# Patient Record
Sex: Female | Born: 1975 | Race: White | Hispanic: No | Marital: Married | Smoking: Never smoker
Health system: Southern US, Community
[De-identification: ages and names within clinical notes are randomized; demographics above are authoritative.]

## PROBLEM LIST (undated history)

## (undated) ENCOUNTER — Inpatient Hospital Stay (HOSPITAL_COMMUNITY): Payer: Self-pay

## (undated) DIAGNOSIS — F32A Depression, unspecified: Secondary | ICD-10-CM

## (undated) DIAGNOSIS — I8390 Asymptomatic varicose veins of unspecified lower extremity: Secondary | ICD-10-CM

## (undated) DIAGNOSIS — R519 Headache, unspecified: Secondary | ICD-10-CM

## (undated) DIAGNOSIS — C801 Malignant (primary) neoplasm, unspecified: Secondary | ICD-10-CM

## (undated) DIAGNOSIS — F329 Major depressive disorder, single episode, unspecified: Secondary | ICD-10-CM

## (undated) DIAGNOSIS — F419 Anxiety disorder, unspecified: Secondary | ICD-10-CM

## (undated) DIAGNOSIS — K219 Gastro-esophageal reflux disease without esophagitis: Secondary | ICD-10-CM

## (undated) DIAGNOSIS — R51 Headache: Secondary | ICD-10-CM

## (undated) DIAGNOSIS — Z98891 History of uterine scar from previous surgery: Secondary | ICD-10-CM

## (undated) HISTORY — PX: COLONOSCOPY: SHX174

## (undated) HISTORY — PX: SIGMOIDECTOMY: SHX176

## (undated) HISTORY — PX: COLON SURGERY: SHX602

## (undated) HISTORY — PX: TONSILLECTOMY: SUR1361

## (undated) HISTORY — DX: Asymptomatic varicose veins of unspecified lower extremity: I83.90

---

## 2006-12-10 ENCOUNTER — Inpatient Hospital Stay (HOSPITAL_COMMUNITY): Admission: RE | Admit: 2006-12-10 | Discharge: 2006-12-13 | Payer: Self-pay | Admitting: Obstetrics and Gynecology

## 2008-04-22 ENCOUNTER — Ambulatory Visit: Payer: Self-pay | Admitting: Internal Medicine

## 2008-04-22 ENCOUNTER — Inpatient Hospital Stay (HOSPITAL_COMMUNITY): Admission: EM | Admit: 2008-04-22 | Discharge: 2008-04-23 | Payer: Self-pay | Admitting: Emergency Medicine

## 2008-04-22 ENCOUNTER — Emergency Department (HOSPITAL_COMMUNITY): Admission: EM | Admit: 2008-04-22 | Discharge: 2008-04-22 | Payer: Self-pay | Admitting: Emergency Medicine

## 2008-04-23 ENCOUNTER — Encounter (INDEPENDENT_AMBULATORY_CARE_PROVIDER_SITE_OTHER): Payer: Self-pay | Admitting: Gastroenterology

## 2011-04-21 NOTE — Op Note (Signed)
NAMESOLASH, TULLO               ACCOUNT NO.:  [id number]   MEDICAL RECORD NO.:  [id number]          PATIENT TYPE:  EMS   LOCATION:  MAJO                         FACILITY:  [facility]   PHYSICIAN:  Shirley Friar, MDDATE OF BIRTH:  1976-03-27   DATE OF PROCEDURE:  DATE OF DISCHARGE:  04/22/2008                               OPERATIVE REPORT   INDICATION:  Hematemesis.   MEDICATIONS:  Fentanyl 75 mcg IV, Versed 8 mg IV, and Phenergan 12.5 mg  IV.   FINDINGS:  Endoscope was inserted through oropharynx, esophagus was  intubated, and advanced down to the GE junction.  No active bleeding was  seen in the esophagus.  The endoscope was advanced into the stomach and  no blood products were noted.  Retroflexion was done, which revealed a  small healing Mallory-Weiss tear at the gastroesophageal junction.  There was some surrounding erythema surrounding that area as well.  Endoscope was straightened and advanced down to the distal part of the  stomach, where a benign-appearing, 4-mm, raised nodule was noted.  Endoscope was advanced into the duodenal bulb and second portion of  duodenum, which were both normal.  Endoscope was withdrawn back into the  stomach and again retroflexion was done, which again revealed this  Mallory-Weiss tear that was healing.  No active bleeding was seen.  Endoscope was straightened and advanced back down to the distal gastric  nodule and one biopsy of that was taken.  Endoscope was then withdrawn  to confirm the above findings.   ASSESSMENT:  1. 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  2. Benign-appearing stomach nodule, status post biopsy.  3. No active bleeding.   PLAN:  1. Follow up with pathology as an outpatient.  2. Liquid diet and advance.  3. Protonix p.o. daily x 4 weeks.  4. Avoid NSAIDs.      Shirley Friar, MD  Electronically Signed     VCS/MEDQ  D:  04/23/2008  T:  04/24/2008  Job:  [id number]   cc:   Lenoard Aden, M.D.

## 2011-04-21 NOTE — Consult Note (Signed)
NAMESAHARA, Sydney Goodwin NO.:  [id number]   MEDICAL RECORD NO.:  [id number]           PATIENT TYPE:   LOCATION:                                 FACILITY:   PHYSICIAN:  Shirley Friar, MD     DATE OF BIRTH:   DATE OF CONSULTATION:  DATE OF DISCHARGE:                                 CONSULTATION   REQUESTING PHYSICIAN:  Angeles Secord.   REASON:  Hematemesis.   HISTORY OF PRESENT ILLNESS:  Ms. Sydney Goodwin is a pleasant 35 year old white  female who was admitted secondary to episodes of coffee-ground emesis  over the weekend.  She had some associated nausea, but denied any severe  abdominal pain.  Has some discomfort after the vomiting that she had.  She does acknowledge taking Advil and Excedrin off and on over the last  few months due to pain while playing tennis.  She denies any previous  episodes of hematemesis.   PAST MEDICAL HISTORY:  1. Anxiety.  2. History of headaches.   MEDICATIONS:  Effexor and NSAIDs (Advil and Excedrin).   SOCIAL HISTORY:  Rare alcohol, denies tobacco.   FAMILY HISTORY:  Noncontributory.   REVIEW OF SYSTEMS:  Negative except as stated above.   PHYSICAL EXAM:  VITAL SIGNS:  Temperature 98.6, pulse 70, blood pressure  115/45, and O2 sat is 98% on room air.  GENERAL:  Alert in no acute distress.  ABDOMEN:  Soft, nontender, nondistended, and active bowel sounds.   LABS:  Hemoglobin 9 down from 12.6 on the Apr 22, 2008, BUN 9 down from  21 on admission, platelet count 158,000, and white blood count 6.7.   IMPRESSION:  A 35 year old white female with 2 episodes of coffee-ground  emesis in the setting of NSAID use.  I suspect upper GI bleed with  differential being peptic ulcer disease versus Mallory-Weiss tear versus  hemorrhagic gastritis.  She could also have erosive esophagitis, but she  probably has a small ulcer that caused the bleeding versus  a Mallory-Weiss tear.  We will plan to do upper endoscopy this morning.  I  discussed the risks and benefits of procedure, and she agrees to  proceed.  Agree with IV Protonix and we will decide on outpatient use of  that pain depending on the upper endoscopy findings.      Shirley Friar, MD  Electronically Signed     VCS/MEDQ  D:  04/23/2008  T:  04/23/2008  Job:  [id number]

## 2011-04-24 NOTE — Discharge Summary (Signed)
Sydney Goodwin, Sydney Goodwin               ACCOUNT NO.:  [id number]   MEDICAL RECORD NO.:  [id number]          PATIENT TYPE:  INP   LOCATION:  9115                          FACILITY:  [facility]   PHYSICIAN:  Lenoard Aden, M.D.DATE OF BIRTH:  August 31, 1976   DATE OF ADMISSION:  12/10/2006  DATE OF DISCHARGE:  12/13/2006                               DISCHARGE SUMMARY   Patient underwent an uncomplicated repeat C-section; postoperative  course uncomplicated.  Discharged to home on day three.  Discharge  teaching done.  Discharge medications to include Tylox, prenatal  vitamins, and iron.  Follow up in the office in four to six weeks.      Lenoard Aden, M.D.  Electronically Signed     RJT/MEDQ  D:  01/25/2007  T:  01/25/2007  Job:  [id number]

## 2011-04-24 NOTE — Discharge Summary (Signed)
Sydney Goodwin, Sydney Goodwin               ACCOUNT NO.:  [id number]   MEDICAL RECORD NO.:  [id number]          PATIENT TYPE:  INP   LOCATION:  2630                         FACILITY:  [facility]   PHYSICIAN:  Madaline Guthrie, M.D.    DATE OF BIRTH:  [birth year]   DATE OF ADMISSION:  04/22/2008  DATE OF DISCHARGE:  04/23/2008                               DISCHARGE SUMMARY   PRIMARY CARE PHYSICIAN:  [facility].   DISCHARGE DIAGNOSES:  1. Upper gastrointestinal bleeding secondary to Mallory-Weiss tear at      gastroesophageal junction.  2. Benign-appearing stomach nodule, awaiting biopsy results.  3. Anxiety.  4. Chronic headache related to stress.  5. Hypokalemia related to vomiting.   DISCHARGE MEDICATIONS:  1. Protonix 40 mg by mouth once daily.  2. Ultram 60 mg by mouth once every 8 hours as needed for pain.  3. Effexor XR 37.5 mg by mouth once daily.   DISPOSITION AND FOLLOWUP:  The patient will follow with her primary care  physician at [facility]  She is got a followup appointment with  Dr. Valentina Lucks on May 01, 2008 at 11 a.m.  At the followup visit, the  patient will be reviewed for resolution of her upper GI bleeding,  nausea, and vomiting.  Also, the primary care physician is requested to  follow the results of the stomach nodule biopsy.  A repeat CBC and BMET  examination is recommended at the followup visit to check the patient's  hemoglobin and hematocrit as well as serum potassium.   STUDIES AND PROCEDURES:  Upper GI endoscopy on Apr 23, 2008.  Findings,  healing Mallory-Weiss tear at gastroesophageal junction.  Benign-  appearing stomach nodule status post biopsy.  No active bleeding.   CONSULTANT:  Shirley Friar, MD, Gastroenterology.   ADMISSION HISTORY:  The patient is a 35 year old with no significant  past medical history other than occipital headaches related to stress.  She presented to ED on Apr 22, 2008 with complaints of vomiting of  blood.  This  happened after she took 4 Advils around 4 p.m. one day  before the presentation.  She also took two Excedrin tablets a few hours  later.  The patient woke up early in the morning on the day of admission  feeling nauseous and vomited dark-colored material.  It was blackish,  but she did not notice any frank blood.  This was followed by blackish  diarrhea.  She denied any pain of abdomen, fever, chest pain, or  shortness of breath.  She came to the ED after this and was sent home  after evaluation.  She had another episode of hematemesis at home, which  she described as being blueberry likely in color.  Hence, she came back  to the ED.   ADMISSION PHYSICAL:  VITAL SIGNS:  Temperature 98.4, blood pressure  140/84, pulse 125, respiratory rate 18, and oxygen saturation 97% on  room air. ORTHOSTATIC VITALS:  Lying 124/74 with pulse rate of 110,  sitting 119/77 with pulse rate of 101, and standing 70/81 with pulse  rate of 118.  GENERAL:  NAD, but was feeling nauseated.  EYES:  PERRLA.  ENT:  Clear oropharynx with moist mucous membranes.  CHEST:  Clear to auscultation bilaterally with good air movement.  CARDIOVASCULAR:  Tachycardiac, but regular rhythm.  No murmur, rubs, or  gallops.  Good pedal pulses.  ABDOMEN:  Soft, tender to percussion in the epigastric area.  No  guarding or rigidity.  No organomegaly could be appreciated.  Positive  bowel sounds.  EXTREMITIES:  No edema.  SKIN:  Moist.  NEURO:  Alert and oriented x3 and nonfocal.  PSYCHE:  Anxious appearing, otherwise appropriate.   ADMISSION LABS:  Sodium 142, potassium 4.2, chloride 107, bicarbonate  22, BUN 20, creatinine 0.7, and blood glucose 112.  Hemoglobin 12.1  followed by 11.1 followed by 9.7 followed by 9.  MCV 91, WBC 10.1, ANC  8.3, and platelets 234.  Bilirubin 0.7, alkaline phosphate 46, AST 17,  ALT 17, protein 6.8, albumin 3.8, and calcium 8.5.  PT 12.5, INR of 0.9,  and PTT 28.  FOBT positive.  Urine pregnancy  test negative.  Urinalysis  negative.   HOSPITAL COURSE BY PROBLEMS:  1. Upper gastrointestinal bleeding.  This was most likely precipitated      by NSAID use.  The patient was admitted to the Step-Down Unit for      close monitoring and workup on her problem.  She was placed n.p.o.,      given IV fluids and antiemetics.  Her CBC was closely monitored as      noted above.  She did not have any postural drop and was having      sinus tachycardia.  She responded well to intravenous fluids, and      although her hemoglobin did drop down to 9, it remained stable      thereafter.  She did not have any further episodes of nausea,      vomiting, or hematemesis in the hospital, although she continued to      have melena.  No frank blood was noted.  A gastroenterological      consultation was subsequently obtained, and she underwent an EGD      examination, results of which are mentioned as above.  The patient      was advised to avoid NSAID and was placed on proton pump      inhibitors.   1. Hypokalemia.  This was noted on the repeat BMET examination and was      repleted with potassium supplement.   1. Anxiety disorder.  Her regular medication Effexor was continued.      The patient also required p.r.n. Ativan for her anxiety.   1. Headache.  She did not have any active pain.  However, the patient      was advised to avoid NSAID in the future.   DISCHARGE DAY LABS:  Hemoglobin 9, hematocrit 26.5, WBC 6.7, MCV 91.6,  and platelets 158.  Sodium 140, potassium 3.4, chloride 113, bicarbonate  22, glucose 100, BUN 9, creatinine 0.59, and calcium 7.9.   DISCHARGE DAY VITALS:  Temperature maximum 98.5, blood pressure systole  95-115, diastolic 50-45, pulse 70, respiratory rate 20, and oxygen  saturation 99% on room air.   On the day of discharge, the patient's condition was stable.  She was no  longer complaining of any nausea, vomiting, or abdominal pain.      [name], MD   Electronically Signed      Madaline Guthrie, M.D.  Electronically Signed  AS/MEDQ  D:  04/25/2008  T:  04/26/2008  Job:  [id number]   cc:   Gretta Arab. Valentina Lucks, M.D.  Shirley Friar, MD

## 2011-04-24 NOTE — Op Note (Signed)
NAMEMASSIE, COGLIANO               ACCOUNT NO.:  [id number]   MEDICAL RECORD NO.:  [id number]          PATIENT TYPE:  INP   LOCATION:  9115                          FACILITY:  [facility]   PHYSICIAN:  Lenoard Aden, M.D.DATE OF BIRTH:  May 08, 1976   DATE OF PROCEDURE:  12/10/2006  DATE OF DISCHARGE:                               OPERATIVE REPORT   PREOPERATIVE DIAGNOSIS:  A 39-week intrauterine pregnancy with previous  cesarean section x2; for repeat.   POSTOPERATIVE DIAGNOSIS:  A 39-week intrauterine pregnancy with previous  cesarean section x2; for repeat.   PROCEDURE:  Repeat low segment transverse cesarean section.   SURGEON:  Lenoard Aden, M.D.   ASSISTANT:  Marlinda Mike, C.N.M.   ESTIMATED BLOOD LOSS:  1000 cc.   COMPLICATIONS:  None.   DRAINS:  Foley.   COUNTS:  Correct.   CONDITION:  The patient to recovery room in good condition.   FINDINGS:  A full-term living female, Agpars 4 and 57.  Occiput anterior  position.  Normal tubes, normal ovaries.  A 2-layer closure of the  uterus.   BRIEF OPERATIVE NOTE:  Being apprised of the risks of anesthesia,  infection, bleeding, injury to abdominal organs and need for repair,  delayed risk and any complications to include bowel and bladder injury.  The patient was brought to the operating room where she was administered  spinal anesthetic without complications.  Prepped and draped in the  usual sterile fashion.  A Foley catheter was placed.  After achieving  adequate anesthesia during spinal anesthetic, the patient was prepped  and draped in the usual sterile fashion.  A Pfannenstiel skin incision  was made with a scalpel and carried down to the fascia, where she was  nicked in the midline and __________ with Mayo scissors; rectus muscles  were dissected sharply in the midline.  Peritoneum entered and  intraperitoneal space entered without difficulty.  Visceroperitoneum  scored sharply __________  segment, Sharl Ma hysterotomy  incision made.  Atraumatic delivery.  Amniotomy with clear fluid.  Used with vacuum  assistance x1 pull.  A full-term living female, as noted.  Cord blood  collected.  Placenta delivered from the posterior position manually  intact.  Vessel cord noted.  Uterus was closed in a running imbricating  2-layer closure of 0 Monocryl suture.  Bladder flap inspected and found  to be hemostatic.  Irrigation accomplished.  Fascia closed using the 0  Monocryl in running fashion.  Skin closed using staples.   The patient tolerated the procedure well.  She was transferred to  recovery room in good condition.      Lenoard Aden, M.D.  Electronically Signed     RJT/MEDQ  D:  12/10/2006  T:  12/10/2006  Job:  [id number]

## 2011-09-02 LAB — COMPREHENSIVE METABOLIC PANEL
ALT: 17
Albumin: 3.8
Calcium: 8.5
GFR calc Af Amer: >60
Glucose, Bld: 114 — ABNORMAL HIGH
Sodium: 139
Total Protein: 6.8

## 2011-09-02 LAB — URINALYSIS, ROUTINE W REFLEX MICROSCOPIC
Glucose, UA: NEGATIVE
Ketones, ur: 15 — AB
Protein, ur: 30 — AB
Urobilinogen, UA: 0.2

## 2011-09-02 LAB — CBC
HCT: 26.5 — ABNORMAL LOW
HCT: 28.3 — ABNORMAL LOW
HCT: 36
MCHC: 34.3
MCV: 90.9
MCV: 91
MCV: 91.6
MCV: 91.8
Platelets: 158
Platelets: 194
Platelets: 220
Platelets: 234
RBC: 3.09 — ABNORMAL LOW
RBC: 3.59 — ABNORMAL LOW
RBC: 3.86 — ABNORMAL LOW
RDW: 13.1
RDW: 13.7
WBC: 10
WBC: 10.1
WBC: 8.9

## 2011-09-02 LAB — POCT I-STAT, CHEM 8
BUN: 21
Creatinine, Ser: 0.7
HCT: 37
Hemoglobin: 12.6
Hemoglobin: 12.6
Potassium: 3.8
Sodium: 139
Sodium: 142
TCO2: 22
TCO2: 23

## 2011-09-02 LAB — CROSSMATCH
ABO/RH(D): A POS
Antibody Screen: NEGATIVE

## 2011-09-02 LAB — BASIC METABOLIC PANEL
BUN: 9
Chloride: 113 — ABNORMAL HIGH
Glucose, Bld: 100 — ABNORMAL HIGH
Potassium: 3.4 — ABNORMAL LOW

## 2011-09-02 LAB — DIFFERENTIAL
Basophils Relative: 0
Eosinophils Absolute: 0.1
Eosinophils Relative: 1
Lymphocytes Relative: 13
Lymphs Abs: 1.3
Lymphs Abs: 1.5
Monocytes Relative: 4
Neutrophils Relative %: 77
Neutrophils Relative %: 83 — ABNORMAL HIGH

## 2011-09-02 LAB — PROTIME-INR: Prothrombin Time: 12.5

## 2011-09-02 LAB — URINE MICROSCOPIC-ADD ON

## 2011-09-02 LAB — OCCULT BLOOD X 1 CARD TO LAB, STOOL: Fecal Occult Bld: POSITIVE

## 2011-09-02 LAB — POCT PREGNANCY, URINE: Operator id: 192351

## 2011-09-02 LAB — ABO/RH: ABO/RH(D): A POS

## 2011-12-03 ENCOUNTER — Ambulatory Visit (INDEPENDENT_AMBULATORY_CARE_PROVIDER_SITE_OTHER): Payer: BC Managed Care – PPO

## 2011-12-03 DIAGNOSIS — F411 Generalized anxiety disorder: Secondary | ICD-10-CM

## 2011-12-03 DIAGNOSIS — F339 Major depressive disorder, recurrent, unspecified: Secondary | ICD-10-CM

## 2012-02-07 ENCOUNTER — Other Ambulatory Visit: Payer: Self-pay | Admitting: Family Medicine

## 2012-02-07 MED ORDER — VENLAFAXINE HCL ER 75 MG PO CP24: 75.0000 mg | ORAL_CAPSULE | Freq: Every day | ORAL | Status: DC

## 2013-01-21 ENCOUNTER — Other Ambulatory Visit: Payer: Self-pay | Admitting: Family Medicine

## 2013-01-21 DIAGNOSIS — R899 Unspecified abnormal finding in specimens from other organs, systems and tissues: Secondary | ICD-10-CM

## 2013-01-23 ENCOUNTER — Other Ambulatory Visit (INDEPENDENT_AMBULATORY_CARE_PROVIDER_SITE_OTHER): Payer: BC Managed Care – PPO | Admitting: Family Medicine

## 2013-01-23 ENCOUNTER — Telehealth: Payer: Self-pay | Admitting: Radiology

## 2013-01-23 ENCOUNTER — Other Ambulatory Visit: Payer: Self-pay | Admitting: Family Medicine

## 2013-01-23 ENCOUNTER — Other Ambulatory Visit: Payer: Self-pay | Admitting: Radiology

## 2013-01-23 VITALS — BP 124/81 | HR 116 | Temp 98.3°F | Resp 16 | Ht 64.75 in | Wt 177.8 lb

## 2013-01-23 DIAGNOSIS — R6889 Other general symptoms and signs: Secondary | ICD-10-CM

## 2013-01-23 DIAGNOSIS — R899 Unspecified abnormal finding in specimens from other organs, systems and tissues: Secondary | ICD-10-CM

## 2013-01-23 LAB — POCT URINE PREGNANCY: Preg Test, Ur: POSITIVE

## 2013-01-23 LAB — POCT URINALYSIS DIPSTICK
Blood, UA: NEGATIVE
Glucose, UA: NEGATIVE
Nitrite, UA: NEGATIVE
Urobilinogen, UA: 0.2

## 2013-01-23 LAB — POCT UA - MICROSCOPIC ONLY
Crystals, Ur, HPF, POC: NEGATIVE
RBC, urine, microscopic: NEGATIVE

## 2013-01-23 LAB — HEPATIC FUNCTION PANEL
Albumin: 4.5 g/dL (ref 3.5–5.2)
Total Protein: 6.8 g/dL (ref 6.0–8.3)

## 2013-01-23 NOTE — Progress Notes (Signed)
Here to have follow- up labs from her OBG.  She was seen at her OBGs office (Dr. Jose Persia NP) about 10 days ago.  She saw them due to night sweats which she had noted for one week- this has since resolved.  She had noted them along with a missed period.  Her hormone levels were all ok- labs ok except her ALT was around 100.  She is here today to recheck her LFTs, I also decided to recheck her urine as she had microhematuria with a UTI in late 2012  She would like to have an ultrasound to evaluate her gallbladder   LMP was 12/03/12.  She has been taking OCP for a while after having her mirena removed in September- took OCP for 6- 8 weeks but then stopped due to SE.  Her husband then had a vasectomy.  They think that pregnancy is unlikely but would like to have a test today.    Results for orders placed in visit on 01/23/13  POCT URINALYSIS DIPSTICK      Result Value Range   Color, UA yellow     Clarity, UA clear     Glucose, UA neg     Bilirubin, UA neg     Ketones, UA neg     Spec Grav, UA <=1.005     Blood, UA neg     pH, UA 6.0     Protein, UA neg     Urobilinogen, UA 0.2     Nitrite, UA neg     Leukocytes, UA Trace    POCT UA - MICROSCOPIC ONLY      Result Value Range   WBC, Ur, HPF, POC 0-2     RBC, urine, microscopic neg     Bacteria, U Microscopic neg     Mucus, UA neg     Epithelial cells, urine per micros neg     Crystals, Ur, HPF, POC neg     Casts, Ur, LPF, POC neg     Yeast, UA neg    POCT URINE PREGNANCY      Result Value Range   Preg Test, Ur Positive     Sydney Goodwin and her husband were quite surprised at her positive HCG today, but they are positive about another child.  She will call her OBG today and arrange an appt.  I will go ahead and order an abdominal ultrasound to evaluate her gallbladder/ liver and recheck LFTs today. Follow- up as soon as results are in.  Ultrasound scheduled for this Thursday 2/19 at 8:15 am, GI on [address].   Noted tachycardia today- Sydney Goodwin  tends to have some anxiety especially around her health, and admits to feeling tense.  She then received some very surprising news.  Her BP is acceptable, no proteinuria

## 2013-01-23 NOTE — Telephone Encounter (Signed)
[facility] they can do this on Thursday 8:30, [address] nothing to eat or drink after midnight. Patient advised

## 2013-01-23 NOTE — Patient Instructions (Addendum)
Please call your OB right away and schedule your first appt.  I will go ahead and set up an ultrasound to check your liver and gallbladder hopefully on Thursday- we will give you a call in  alittle bit.    Assuming your true last period was 12/03/12 you will be due 09/09/2013 and you are 7+2 weeks today Please start taking a pre- natal vitamin and stop taking your effexor- you could also discuss taking the effexor with Dr. Billy Coast to see if you might be able to continue using it.

## 2013-01-24 ENCOUNTER — Telehealth: Payer: Self-pay

## 2013-01-24 ENCOUNTER — Telehealth: Payer: Self-pay | Admitting: Family Medicine

## 2013-01-24 LAB — IRON AND TIBC: %SAT: 23 % (ref 20–55)

## 2013-01-24 NOTE — Telephone Encounter (Signed)
Message copied by Johnnette Litter on Tue Jan 24, 2013  4:43 PM ------      Message from: Abbe Amsterdam C      Created: Tue Jan 24, 2013  2:36 PM       Hi! Can we add on some labs (if possible)- PT/ INR, iron, ferritin and TIBC, and chronic hep panel.        Dx: elevated liver enzymes            Thanks so much!        JC ------

## 2013-01-24 NOTE — Telephone Encounter (Signed)
Left message on machine- I called [facility] and set up an appt for Thursday 2/20 at 10:45 am with Dr. Dulce Sellar.  Please let me know if that will not work, and I am going to have the lab add on some additional tests that will be needed as part of elevated LFT work- up.    Hep B and Hep C panel Iron and TIBC Ferritin PT/INR

## 2013-01-24 NOTE — Telephone Encounter (Signed)
Spoke with Selena Batten at [facility]. I added on the Fe/TIBC and the Ferritin. Unable to add on PT/INR because it is done on a blue top. Before adding on the Chronic Hep, I wanted to double check. Ins won't pay for a Chronic panel and it's expensive. Is an acute panel ok?

## 2013-01-24 NOTE — Telephone Encounter (Signed)
Yes. Acute panel is ok- thanks!

## 2013-01-25 LAB — HEPATITIS PANEL, ACUTE
Hep A IgM: NEGATIVE
Hep B C IgM: NEGATIVE

## 2013-01-25 NOTE — Telephone Encounter (Signed)
Have notified lab. Luster Landsberg will take care of add on labs.

## 2013-01-26 ENCOUNTER — Ambulatory Visit
Admission: RE | Admit: 2013-01-26 | Discharge: 2013-01-26 | Disposition: A | Discharge status: Home / Self Care | Payer: BC Managed Care – PPO | Source: Ambulatory Visit | Attending: Family Medicine | Admitting: Family Medicine

## 2013-02-15 LAB — OB RESULTS CONSOLE ABO/RH: RH Type: POSITIVE

## 2013-02-15 LAB — OB RESULTS CONSOLE HIV ANTIBODY (ROUTINE TESTING): HIV: NONREACTIVE

## 2013-02-15 LAB — OB RESULTS CONSOLE RPR: RPR: NONREACTIVE

## 2013-04-03 ENCOUNTER — Telehealth: Payer: Self-pay | Admitting: Family Medicine

## 2013-04-03 DIAGNOSIS — R3129 Other microscopic hematuria: Secondary | ICD-10-CM

## 2013-04-03 NOTE — Telephone Encounter (Signed)
Called to discuss with her today.  She is doing well, pregnancy is going well and her LFTs have come back to near normal.  Shared that I would like to have her seen by urology, to be sure there is nothing else of concern.  She is willing to proceed. Will do referral for her

## 2013-06-04 ENCOUNTER — Inpatient Hospital Stay (HOSPITAL_COMMUNITY)
Admission: AD | Admit: 2013-06-04 | Discharge: 2013-06-04 | Disposition: A | Discharge status: Home / Self Care | Payer: BC Managed Care – PPO | Source: Ambulatory Visit | Attending: Obstetrics & Gynecology | Admitting: Obstetrics & Gynecology

## 2013-06-04 ENCOUNTER — Encounter (HOSPITAL_COMMUNITY): Payer: Self-pay

## 2013-06-04 DIAGNOSIS — O99891 Other specified diseases and conditions complicating pregnancy: Secondary | ICD-10-CM | POA: Insufficient documentation

## 2013-06-04 DIAGNOSIS — W010XXA Fall on same level from slipping, tripping and stumbling without subsequent striking against object, initial encounter: Secondary | ICD-10-CM | POA: Insufficient documentation

## 2013-06-04 DIAGNOSIS — S90129A Contusion of unspecified lesser toe(s) without damage to nail, initial encounter: Secondary | ICD-10-CM | POA: Insufficient documentation

## 2013-06-04 DIAGNOSIS — IMO0002 Reserved for concepts with insufficient information to code with codable children: Secondary | ICD-10-CM | POA: Insufficient documentation

## 2013-06-04 DIAGNOSIS — Y929 Unspecified place or not applicable: Secondary | ICD-10-CM | POA: Insufficient documentation

## 2013-06-04 HISTORY — DX: Gastro-esophageal reflux disease without esophagitis: K21.9

## 2013-06-04 HISTORY — DX: Depression, unspecified: F32.A

## 2013-06-04 HISTORY — DX: Anxiety disorder, unspecified: F41.9

## 2013-06-04 HISTORY — DX: Major depressive disorder, single episode, unspecified: F32.9

## 2013-06-04 NOTE — MAU Provider Note (Signed)
  History   Minor trauma. Tripped while running in a dress. No direct abdominal trauma. Pos FM. No contractions, bleeding or LOF.   CSN: [id number]  Arrival date and time: 06/04/13 1954   None     Chief Complaint  Patient presents with  . Fall   HPI  OB History   Grav Para Term Preterm Abortions TAB SAB Ect Mult Living   4 3 3  0 0 0 0 0 0 3      No past medical history on file.  No past surgical history on file.  No family history on file.  History  Substance Use Topics  . Smoking status: Never Smoker   . Smokeless tobacco: Not on file  . Alcohol Use: Not on file    Allergies:  Allergies  Allergen Reactions  . Penicillins Itching and Rash    Prescriptions prior to admission  Medication Sig Dispense Refill  . ibuprofen (ADVIL,MOTRIN) 200 MG tablet Take 400 mg by mouth every 6 (six) hours as needed for pain.      . Prenatal Vit-Fe Fumarate-FA (PRENATAL MULTIVITAMIN) TABS Take 1 tablet by mouth every morning.      . ranitidine (ZANTAC) 150 MG tablet Take 150-300 mg by mouth at bedtime.        ROS Physical Exam   Last menstrual period 12/03/2012.  Physical Exam WDWN WF NAD NCAT Neck : supple with FROM Lgs: CTA CV: RRR Abd: soft , gravid and nontender Neuro non focal Skin : Ecchymotic great toe Minor abrasions to both knees  NST pending  MAU Course  Procedures  MDM na  Assessment and Plan  Minor OB trauma- no direct abdominal trauma or injury 27 3/7 weeks DC home pending NST x one hr. Local care discussed FAC and Labor precautions. Abruption precautions.  Harlon Kutner J 06/04/2013, 8:23 PM

## 2013-06-04 NOTE — MAU Note (Signed)
Pt fell by catching her toe on her maxi dress.  She says she fell to her knees and caught herself with her hands and she thinks she hit her abdomen.  Her left great toe is bruised and blue and both knees are scraped.  Denies bleeding, leaking or cramps in her abdomen.

## 2013-07-27 ENCOUNTER — Other Ambulatory Visit: Payer: Self-pay | Admitting: Obstetrics and Gynecology

## 2013-07-31 LAB — OB RESULTS CONSOLE GBS: GBS: NEGATIVE

## 2013-08-02 ENCOUNTER — Other Ambulatory Visit: Payer: Self-pay | Admitting: Obstetrics and Gynecology

## 2013-08-08 ENCOUNTER — Encounter (HOSPITAL_COMMUNITY): Payer: Self-pay | Admitting: Pharmacist

## 2013-08-22 ENCOUNTER — Encounter (HOSPITAL_COMMUNITY): Payer: Self-pay

## 2013-08-22 ENCOUNTER — Encounter (HOSPITAL_COMMUNITY)
Admission: RE | Admit: 2013-08-22 | Discharge: 2013-08-22 | Disposition: A | Discharge status: Home / Self Care | Payer: BC Managed Care – PPO | Source: Ambulatory Visit | Attending: Obstetrics and Gynecology | Admitting: Obstetrics and Gynecology

## 2013-08-22 LAB — TYPE AND SCREEN: ABO/RH(D): A POS

## 2013-08-22 LAB — CBC
HCT: 30.6 % — ABNORMAL LOW (ref 36.0–46.0)
MCH: 31.4 pg (ref 26.0–34.0)
MCHC: 34.3 g/dL (ref 30.0–36.0)
RDW: 13 % (ref 11.5–15.5)

## 2013-08-22 LAB — RPR: RPR Ser Ql: NONREACTIVE

## 2013-08-22 NOTE — Patient Instructions (Addendum)
18 [birth year][address]UT[address]  08/22[birth year]2013   Your procedure is scheduled on:  08/24/13  Enter through the Main Entrance of [facility] at 6 AM.  Pick up the phone at the desk and dial [phone].   Call this number if you have problems the morning of surgery: [phone]   Remember:   Do not eat food:After Midnight.  Do not drink clear liquids: After Midnight.  Take these medicines the morning of surgery with A SIP OF WATER: zantac   Do not wear jewelry, make-up or nail polish.  Do not wear lotions, powders, or perfumes. You may wear deodorant.  Do not shave 48 hours prior to surgery.  Do not bring valuables to the hospital.  [facility] is not   responsible for any belongings or valuables brought to the hospital.  Contacts, dentures or bridgework may not be worn into surgery.  Leave suitcase in the car. After surgery it may be brought to your room.  For patients admitted to the hospital, checkout time is 11:00 AM the day of              discharge.   Patients discharged the day of surgery will not be allowed to drive             home.  Name and phone number of your driver: NA  Special Instructions:   Shower using CHG 2 nights before surgery and the night before surgery.  If you shower the day of surgery use CHG.  Use special wash - you have one bottle of CHG for all showers.  You should use approximately 1/3 of the bottle for each shower.   Please read over the following fact sheets that you were given:   Surgical Site Infection Prevention

## 2013-08-23 NOTE — H&P (Signed)
Sydney Goodwin, BOTZ               ACCOUNT NO.:  [id number]  MEDICAL RECORD NO.:  [id number]  LOCATION:  PERIO                         FACILITY:  [facility]  PHYSICIAN:  Lenoard Aden, M.D.DATE OF BIRTH:  06-15-1976  DATE OF ADMISSION:  07/12/2013 DATE OF DISCHARGE:  08/22/2013                             HISTORY & PHYSICAL   CHIEF COMPLAINT:  Previous C-section for repeat.  HISTORY OF PRESENT ILLNESS:  She is a 37 year old white female, G4, P3, previous C-section x3, who presents for repeat cesarean section.  ALLERGIES:  She has allergies to penicillin.  MEDICATIONS:  Include prenatal vitamins, Zantac, and Tums as needed.  SOCIAL HISTORY:  She is a nonsmoker, nondrinker.  She denies domestic or physical violence.  PREVIOUS HISTORY:  Surgical history remarkable for C-section x3.  FAMILY HISTORY:  She has a family history of diabetes, kidney stones, and lung cancer.  Prenatal course uncomplicated.  PHYSICAL EXAMINATION:  GENERAL:  This is a well-developed, well- nourished white female, in no acute distress. HEENT:  Normal. NECK:  Supple.  Full range of motion. LUNGS:  Clear. HEART:  Regular rate and rhythm. ABDOMEN:  Soft, gravid, nontender.  No CVA tenderness. EXTREMITIES:  No cords. NEUROLOGIC:  Nonfocal. SKIN:  Intact. PELVIC:  Reveals cervix to be closed, 60%, vertex, -3.  IMPRESSION:  Previous C-section x3 for repeat.  PLAN:  Proceed with repeat low segment transverse cesarean section. Risks of anesthesia, infection, bleeding, injury to surrounding organs, need for repair was discussed, delayed versus immediate complications to include bowel and bladder injury noted.  The patient acknowledges and wishes to proceed.     Lenoard Aden, M.D.     RJT/MEDQ  D:  08/23/2013  T:  08/23/2013  Job:  [id number]

## 2013-08-24 ENCOUNTER — Encounter (HOSPITAL_COMMUNITY): Payer: Self-pay | Admitting: *Deleted

## 2013-08-24 ENCOUNTER — Encounter (HOSPITAL_COMMUNITY)
Admission: AD | Disposition: A | Discharge status: Home / Self Care | Payer: Self-pay | Source: Ambulatory Visit | Attending: Obstetrics and Gynecology

## 2013-08-24 ENCOUNTER — Inpatient Hospital Stay (HOSPITAL_COMMUNITY)
Admission: AD | Admit: 2013-08-24 | Discharge: 2013-08-26 | DRG: 370 | Disposition: A | Discharge status: Home / Self Care | Payer: BC Managed Care – PPO | Source: Ambulatory Visit | Attending: Obstetrics and Gynecology | Admitting: Obstetrics and Gynecology

## 2013-08-24 ENCOUNTER — Inpatient Hospital Stay (HOSPITAL_COMMUNITY): Payer: BC Managed Care – PPO | Admitting: Anesthesiology

## 2013-08-24 ENCOUNTER — Encounter (HOSPITAL_COMMUNITY): Payer: Self-pay | Admitting: Anesthesiology

## 2013-08-24 DIAGNOSIS — O9903 Anemia complicating the puerperium: Secondary | ICD-10-CM | POA: Diagnosis not present

## 2013-08-24 DIAGNOSIS — Z98891 History of uterine scar from previous surgery: Secondary | ICD-10-CM

## 2013-08-24 DIAGNOSIS — D62 Acute posthemorrhagic anemia: Secondary | ICD-10-CM | POA: Diagnosis not present

## 2013-08-24 DIAGNOSIS — Z302 Encounter for sterilization: Secondary | ICD-10-CM

## 2013-08-24 DIAGNOSIS — O34219 Maternal care for unspecified type scar from previous cesarean delivery: Principal | ICD-10-CM | POA: Diagnosis present

## 2013-08-24 DIAGNOSIS — Z880 Allergy status to penicillin: Secondary | ICD-10-CM

## 2013-08-24 HISTORY — DX: History of uterine scar from previous surgery: Z98.891

## 2013-08-24 SURGERY — Surgical Case
Anesthesia: Spinal | Laterality: Bilateral

## 2013-08-24 MED ORDER — PHENYLEPHRINE HCL 10 MG/ML IJ SOLN
INTRAMUSCULAR | Status: DC | PRN
  Administered 2013-08-24 (×2): 80 ug via INTRAVENOUS
  Administered 2013-08-24: 140 ug via INTRAVENOUS

## 2013-08-24 MED ORDER — FENTANYL CITRATE 0.05 MG/ML IJ SOLN
INTRAMUSCULAR | Status: AC
  Filled 2013-08-24: qty 2

## 2013-08-24 MED ORDER — ONDANSETRON HCL 4 MG/2ML IJ SOLN: 4.0000 mg | INTRAMUSCULAR | Status: DC | PRN

## 2013-08-24 MED ORDER — FENTANYL 10 MCG/ML IV SOLN
INTRAVENOUS | Status: DC
  Administered 2013-08-24: 15 ug via INTRAVENOUS
  Filled 2013-08-24: qty 50

## 2013-08-24 MED ORDER — CEFAZOLIN SODIUM-DEXTROSE 2-3 GM-% IV SOLR
INTRAVENOUS | Status: AC
  Filled 2013-08-24: qty 50

## 2013-08-24 MED ORDER — MORPHINE SULFATE 0.5 MG/ML IJ SOLN
INTRAMUSCULAR | Status: AC
  Filled 2013-08-24: qty 10

## 2013-08-24 MED ORDER — LANOLIN HYDROUS EX OINT: 1.0000 "application " | TOPICAL_OINTMENT | CUTANEOUS | Status: DC | PRN

## 2013-08-24 MED ORDER — LACTATED RINGERS IV SOLN
INTRAVENOUS | Status: DC
  Administered 2013-08-24 (×4): via INTRAVENOUS

## 2013-08-24 MED ORDER — EPHEDRINE 5 MG/ML INJ
INTRAVENOUS | Status: AC
  Filled 2013-08-24: qty 10

## 2013-08-24 MED ORDER — OXYCODONE-ACETAMINOPHEN 5-325 MG PO TABS
1.0000 | ORAL_TABLET | ORAL | Status: DC | PRN
  Administered 2013-08-24: 2 via ORAL
  Administered 2013-08-24: 1 via ORAL
  Administered 2013-08-24 – 2013-08-25 (×2): 2 via ORAL
  Filled 2013-08-24 (×4): qty 2

## 2013-08-24 MED ORDER — ONDANSETRON HCL 4 MG PO TABS: 4.0000 mg | ORAL_TABLET | ORAL | Status: DC | PRN

## 2013-08-24 MED ORDER — ONDANSETRON HCL 4 MG/2ML IJ SOLN
INTRAMUSCULAR | Status: DC | PRN
  Administered 2013-08-24: 4 mg via INTRAVENOUS

## 2013-08-24 MED ORDER — DIPHENHYDRAMINE HCL 50 MG/ML IJ SOLN
12.5000 mg | INTRAMUSCULAR | Status: DC | PRN
  Administered 2013-08-24: 12.5 mg via INTRAVENOUS

## 2013-08-24 MED ORDER — SCOPOLAMINE 1 MG/3DAYS TD PT72: 1.0000 | MEDICATED_PATCH | Freq: Once | TRANSDERMAL | Status: DC

## 2013-08-24 MED ORDER — EPHEDRINE SULFATE 50 MG/ML IJ SOLN
INTRAMUSCULAR | Status: DC | PRN
  Administered 2013-08-24: 10 mg via INTRAVENOUS

## 2013-08-24 MED ORDER — CEFAZOLIN SODIUM-DEXTROSE 2-3 GM-% IV SOLR
2.0000 g | INTRAVENOUS | Status: AC
  Administered 2013-08-24: 2 g via INTRAVENOUS

## 2013-08-24 MED ORDER — PHENYLEPHRINE 40 MCG/ML (10ML) SYRINGE FOR IV PUSH (FOR BLOOD PRESSURE SUPPORT)
PREFILLED_SYRINGE | INTRAVENOUS | Status: AC
  Filled 2013-08-24: qty 5

## 2013-08-24 MED ORDER — DIPHENHYDRAMINE HCL 12.5 MG/5ML PO ELIX
12.5000 mg | ORAL_SOLUTION | Freq: Four times a day (QID) | ORAL | Status: DC | PRN
  Filled 2013-08-24: qty 5

## 2013-08-24 MED ORDER — SENNOSIDES-DOCUSATE SODIUM 8.6-50 MG PO TABS
2.0000 | ORAL_TABLET | ORAL | Status: DC
  Administered 2013-08-25 – 2013-08-26 (×2): 2 via ORAL

## 2013-08-24 MED ORDER — KETOROLAC TROMETHAMINE 30 MG/ML IJ SOLN
15.0000 mg | Freq: Once | INTRAMUSCULAR | Status: AC | PRN
  Administered 2013-08-24: 30 mg via INTRAVENOUS

## 2013-08-24 MED ORDER — SIMETHICONE 80 MG PO CHEW: 80.0000 mg | CHEWABLE_TABLET | ORAL | Status: DC | PRN

## 2013-08-24 MED ORDER — FENTANYL CITRATE 0.05 MG/ML IJ SOLN: 25.0000 ug | INTRAMUSCULAR | Status: DC | PRN

## 2013-08-24 MED ORDER — SIMETHICONE 80 MG PO CHEW
80.0000 mg | CHEWABLE_TABLET | Freq: Three times a day (TID) | ORAL | Status: DC
  Administered 2013-08-24 – 2013-08-26 (×7): 80 mg via ORAL

## 2013-08-24 MED ORDER — SODIUM CHLORIDE 0.9 % IJ SOLN: 9.0000 mL | INTRAMUSCULAR | Status: DC | PRN

## 2013-08-24 MED ORDER — ONDANSETRON HCL 4 MG/2ML IJ SOLN
INTRAMUSCULAR | Status: AC
  Filled 2013-08-24: qty 2

## 2013-08-24 MED ORDER — LACTATED RINGERS IV SOLN
Freq: Once | INTRAVENOUS | Status: AC
  Administered 2013-08-24: 07:00:00 via INTRAVENOUS

## 2013-08-24 MED ORDER — FENTANYL CITRATE 0.05 MG/ML IJ SOLN
INTRAMUSCULAR | Status: DC | PRN
  Administered 2013-08-24: 25 ug via INTRATHECAL

## 2013-08-24 MED ORDER — METHYLERGONOVINE MALEATE 0.2 MG PO TABS: 0.2000 mg | ORAL_TABLET | ORAL | Status: DC | PRN

## 2013-08-24 MED ORDER — DIPHENHYDRAMINE HCL 25 MG PO CAPS
25.0000 mg | ORAL_CAPSULE | ORAL | Status: DC | PRN
  Administered 2013-08-25 – 2013-08-26 (×2): 25 mg via ORAL
  Filled 2013-08-24 (×2): qty 1

## 2013-08-24 MED ORDER — BUPIVACAINE HCL (PF) 0.25 % IJ SOLN
INTRAMUSCULAR | Status: DC | PRN
  Administered 2013-08-24: 10 mL

## 2013-08-24 MED ORDER — BUPIVACAINE HCL (PF) 0.25 % IJ SOLN
INTRAMUSCULAR | Status: AC
  Filled 2013-08-24: qty 30

## 2013-08-24 MED ORDER — IBUPROFEN 600 MG PO TABS
600.0000 mg | ORAL_TABLET | Freq: Four times a day (QID) | ORAL | Status: DC
  Administered 2013-08-24 – 2013-08-26 (×8): 600 mg via ORAL
  Filled 2013-08-24 (×8): qty 1

## 2013-08-24 MED ORDER — BUPIVACAINE HCL (PF) 0.5 % IJ SOLN
INTRAMUSCULAR | Status: DC | PRN
  Administered 2013-08-24: 12 mg

## 2013-08-24 MED ORDER — MEPERIDINE HCL 25 MG/ML IJ SOLN: 6.2500 mg | INTRAMUSCULAR | Status: DC | PRN

## 2013-08-24 MED ORDER — WITCH HAZEL-GLYCERIN EX PADS: 1.0000 "application " | MEDICATED_PAD | CUTANEOUS | Status: DC | PRN

## 2013-08-24 MED ORDER — NALBUPHINE HCL 10 MG/ML IJ SOLN
10.0000 mg | INTRAMUSCULAR | Status: DC | PRN
  Administered 2013-08-24: 10 mg via SUBCUTANEOUS
  Filled 2013-08-24: qty 1

## 2013-08-24 MED ORDER — OXYTOCIN 10 UNIT/ML IJ SOLN
INTRAMUSCULAR | Status: AC
  Filled 2013-08-24: qty 4

## 2013-08-24 MED ORDER — TETANUS-DIPHTH-ACELL PERTUSSIS 5-2.5-18.5 LF-MCG/0.5 IM SUSP
0.5000 mL | Freq: Once | INTRAMUSCULAR | Status: AC
Start: 2013-08-25 — End: 2013-08-25
  Administered 2013-08-25: 0.5 mL via INTRAMUSCULAR
  Filled 2013-08-24 (×2): qty 0.5

## 2013-08-24 MED ORDER — DIPHENHYDRAMINE HCL 50 MG/ML IJ SOLN: 12.5000 mg | Freq: Four times a day (QID) | INTRAMUSCULAR | Status: DC | PRN

## 2013-08-24 MED ORDER — NALOXONE HCL 0.4 MG/ML IJ SOLN: 0.4000 mg | INTRAMUSCULAR | Status: DC | PRN

## 2013-08-24 MED ORDER — ACETAMINOPHEN 500 MG PO TABS
1000.0000 mg | ORAL_TABLET | Freq: Four times a day (QID) | ORAL | Status: DC
  Administered 2013-08-25 (×2): 1000 mg via ORAL
  Filled 2013-08-24 (×2): qty 2

## 2013-08-24 MED ORDER — LACTATED RINGERS IV SOLN: INTRAVENOUS | Status: DC

## 2013-08-24 MED ORDER — METHYLERGONOVINE MALEATE 0.2 MG/ML IJ SOLN: 0.2000 mg | INTRAMUSCULAR | Status: DC | PRN

## 2013-08-24 MED ORDER — OXYTOCIN 40 UNITS IN LACTATED RINGERS INFUSION - SIMPLE MED: 62.5000 mL/h | INTRAVENOUS | Status: AC

## 2013-08-24 MED ORDER — SCOPOLAMINE 1 MG/3DAYS TD PT72
MEDICATED_PATCH | TRANSDERMAL | Status: AC
  Administered 2013-08-24: 1.5 mg via TRANSDERMAL
  Filled 2013-08-24: qty 1

## 2013-08-24 MED ORDER — ZOLPIDEM TARTRATE 5 MG PO TABS: 5.0000 mg | ORAL_TABLET | Freq: Every evening | ORAL | Status: DC | PRN

## 2013-08-24 MED ORDER — ONDANSETRON HCL 4 MG/2ML IJ SOLN: 4.0000 mg | Freq: Four times a day (QID) | INTRAMUSCULAR | Status: DC | PRN

## 2013-08-24 MED ORDER — DIBUCAINE 1 % RE OINT: 1.0000 "application " | TOPICAL_OINTMENT | RECTAL | Status: DC | PRN

## 2013-08-24 MED ORDER — SIMETHICONE 80 MG PO CHEW
80.0000 mg | CHEWABLE_TABLET | ORAL | Status: DC
  Administered 2013-08-25: 80 mg via ORAL

## 2013-08-24 MED ORDER — DIPHENHYDRAMINE HCL 50 MG/ML IJ SOLN: 25.0000 mg | INTRAMUSCULAR | Status: DC | PRN

## 2013-08-24 MED ORDER — DIPHENHYDRAMINE HCL 25 MG PO CAPS
25.0000 mg | ORAL_CAPSULE | Freq: Four times a day (QID) | ORAL | Status: DC | PRN
  Filled 2013-08-24: qty 1

## 2013-08-24 MED ORDER — NALBUPHINE SYRINGE 5 MG/0.5 ML
INJECTION | INTRAMUSCULAR | Status: AC
  Administered 2013-08-24: 10 mg via SUBCUTANEOUS
  Filled 2013-08-24: qty 1

## 2013-08-24 MED ORDER — MIDAZOLAM HCL 2 MG/2ML IJ SOLN: 0.5000 mg | Freq: Once | INTRAMUSCULAR | Status: DC | PRN

## 2013-08-24 MED ORDER — OXYTOCIN 10 UNIT/ML IJ SOLN
40.0000 [IU] | INTRAVENOUS | Status: DC | PRN
  Administered 2013-08-24: 40 [IU] via INTRAVENOUS

## 2013-08-24 MED ORDER — KETOROLAC TROMETHAMINE 30 MG/ML IJ SOLN
INTRAMUSCULAR | Status: AC
  Administered 2013-08-24: 30 mg via INTRAVENOUS
  Filled 2013-08-24: qty 1

## 2013-08-24 MED ORDER — MENTHOL 3 MG MT LOZG: 1.0000 | LOZENGE | OROMUCOSAL | Status: DC | PRN

## 2013-08-24 MED ORDER — PRENATAL MULTIVITAMIN CH
1.0000 | ORAL_TABLET | Freq: Every day | ORAL | Status: DC
  Administered 2013-08-25 – 2013-08-26 (×2): 1 via ORAL
  Filled 2013-08-24 (×2): qty 1

## 2013-08-24 MED ORDER — FAMOTIDINE 20 MG PO TABS: 20.0000 mg | ORAL_TABLET | Freq: Two times a day (BID) | ORAL | Status: DC | PRN

## 2013-08-24 MED ORDER — PROMETHAZINE HCL 25 MG/ML IJ SOLN: 6.2500 mg | INTRAMUSCULAR | Status: DC | PRN

## 2013-08-24 MED ORDER — DIPHENHYDRAMINE HCL 50 MG/ML IJ SOLN
INTRAMUSCULAR | Status: AC
  Administered 2013-08-24: 12.5 mg via INTRAVENOUS
  Filled 2013-08-24: qty 1

## 2013-08-24 SURGICAL SUPPLY — 35 items
CLAMP CORD UMBIL (MISCELLANEOUS) IMPLANT
CLEANER TIP ELECTROSURG 2X2 (MISCELLANEOUS) ×2 IMPLANT
CLOTH BEACON ORANGE TIMEOUT ST (SAFETY) ×2 IMPLANT
CONTAINER PREFILL 10% NBF 15ML (MISCELLANEOUS) IMPLANT
DEVICE BLD TRNS LUER ATTCH (MISCELLANEOUS) ×2 IMPLANT
DRAPE LG THREE QUARTER DISP (DRAPES) ×4 IMPLANT
DRSG OPSITE POSTOP 4X10 (GAUZE/BANDAGES/DRESSINGS) ×2 IMPLANT
DURAPREP 26ML APPLICATOR (WOUND CARE) ×2 IMPLANT
ELECT REM PT RETURN 9FT ADLT (ELECTROSURGICAL) ×2
ELECTRODE REM PT RTRN 9FT ADLT (ELECTROSURGICAL) ×1 IMPLANT
EXTRACTOR VACUUM M CUP 4 TUBE (SUCTIONS) IMPLANT
GLOVE BIO SURGEON STRL SZ7.5 (GLOVE) ×2 IMPLANT
GOWN PREVENTION PLUS XLARGE (GOWN DISPOSABLE) ×4 IMPLANT
GOWN STRL NON-REIN LRG LVL3 (GOWN DISPOSABLE) ×2 IMPLANT
GOWN STRL REIN XL XLG (GOWN DISPOSABLE) ×2 IMPLANT
KIT ABG SYR 3ML LUER SLIP (SYRINGE) IMPLANT
NDL HYPO 25X1 1.5 SAFETY (NEEDLE) ×1 IMPLANT
NDL HYPO 25X5/8 SAFETYGLIDE (NEEDLE) IMPLANT
NEEDLE HYPO 25X1 1.5 SAFETY (NEEDLE) ×2 IMPLANT
NEEDLE HYPO 25X5/8 SAFETYGLIDE (NEEDLE) IMPLANT
NS IRRIG 1000ML POUR BTL (IV SOLUTION) ×2 IMPLANT
PACK C SECTION WH (CUSTOM PROCEDURE TRAY) ×2 IMPLANT
PAD OB MATERNITY 4.3X12.25 (PERSONAL CARE ITEMS) ×2 IMPLANT
PENCIL BUTTON HOLSTER BLD 10FT (ELECTRODE) ×2 IMPLANT
STAPLER VISISTAT 35W (STAPLE) ×2 IMPLANT
SUT MNCRL 0 VIOLET CTX 36 (SUTURE) ×2 IMPLANT
SUT MON AB 2-0 CT1 27 (SUTURE) ×2 IMPLANT
SUT MON AB-0 CT1 36 (SUTURE) ×4 IMPLANT
SUT MONOCRYL 0 CTX 36 (SUTURE) ×2
SUT PLAIN 0 NONE (SUTURE) IMPLANT
SUT PLAIN 2 0 XLH (SUTURE) IMPLANT
SYR CONTROL 10ML LL (SYRINGE) ×2 IMPLANT
TOWEL OR 17X24 6PK STRL BLUE (TOWEL DISPOSABLE) ×2 IMPLANT
TRAY FOLEY CATH 14FR (SET/KITS/TRAYS/PACK) ×2 IMPLANT
WATER STERILE IRR 1000ML POUR (IV SOLUTION) ×2 IMPLANT

## 2013-08-24 NOTE — Progress Notes (Signed)
Patient requests to have PCA discontinued and wants to take po pain medicines.  Nurse called Dr. Billy Coast and received verbal order to discontinue IV PCA and start on PO Percoct- that is already on patient's orders. Patient tolerating fluids well.- mother baby RN- Emilio Math RNC

## 2013-08-24 NOTE — Progress Notes (Signed)
Patient ID: Sydney Goodwin, female   DOB: Dec 16, 1975, 37 y.o.   MRN: [id number] Patient seen and examined. Consent witnessed and signed. No changes noted. Update completed. CBC    Component Value Date/Time   WBC 7.9 08/22/2013 1105   RBC 3.34* 08/22/2013 1105   HGB 10.5* 08/22/2013 1105   HCT 30.6* 08/22/2013 1105   PLT 243 08/22/2013 1105   MCV 91.6 08/22/2013 1105   MCH 31.4 08/22/2013 1105   MCHC 34.3 08/22/2013 1105   RDW 13.0 08/22/2013 1105   LYMPHSABS 1.3 04/22/2008 0905   MONOABS 0.4 04/22/2008 0905   EOSABS 0.0 04/22/2008 0905   BASOSABS 0.0 04/22/2008 0905

## 2013-08-24 NOTE — Anesthesia Postprocedure Evaluation (Signed)
  Anesthesia Post-op Note  Patient: Sydney Goodwin  Procedure(s) Performed: Procedure(s): REPEAT CESAREAN SECTION WITH BILATERAL TUBAL LIGATION (Bilateral)  Patient Location: Mother/Baby  Anesthesia Type:Spinal  Level of Consciousness: awake  Airway and Oxygen Therapy: Patient Spontanous Breathing  Post-op Pain: mild  Post-op Assessment: Patient's Cardiovascular Status Stable and Respiratory Function Stable  Post-op Vital Signs: stable  Complications: No apparent anesthesia complications

## 2013-08-24 NOTE — Transfer of Care (Signed)
Immediate Anesthesia Transfer of Care Note  Patient: Sydney Goodwin  Procedure(s) Performed: Procedure(s): REPEAT CESAREAN SECTION WITH BILATERAL TUBAL LIGATION (Bilateral)  Patient Location: PACU  Anesthesia Type:Spinal  Level of Consciousness: awake, alert  and oriented  Airway & Oxygen Therapy: Patient Spontanous Breathing  Post-op Assessment: Report given to PACU RN and Post -op Vital signs reviewed and stable  Post vital signs: Reviewed and stable  Complications: No apparent anesthesia complications

## 2013-08-24 NOTE — Anesthesia Postprocedure Evaluation (Signed)
  Anesthesia Post-op Note  Anesthesia Post Note  Patient: Sydney Goodwin  Procedure(s) Performed: Procedure(s) (LRB): REPEAT CESAREAN SECTION WITH BILATERAL TUBAL LIGATION (Bilateral)  Anesthesia type: Spinal  Patient location: PACU  Post pain: Pain level controlled  Post assessment: Post-op Vital signs reviewed  Last Vitals:  Filed Vitals:   08/24/13 1030  BP:   Pulse:   Temp: 36.9 C  Resp: 18    Post vital signs: Reviewed  Level of consciousness: awake  Complications: No apparent anesthesia complications

## 2013-08-24 NOTE — Anesthesia Procedure Notes (Signed)
Spinal  Patient location during procedure: OR Start time: 08/24/2013 8:05 AM Staffing Anesthesiologist: Angus Seller., Harrell Gave. Performed by: anesthesiologist  Preanesthetic Checklist Completed: patient identified, site marked, surgical consent, pre-op evaluation, timeout performed, IV checked, risks and benefits discussed and monitors and equipment checked Spinal Block Patient position: sitting Prep: DuraPrep Patient monitoring: heart rate, cardiac monitor, continuous pulse ox and blood pressure Approach: midline Location: L3-4 Injection technique: single-shot Needle Needle type: Sprotte  Needle gauge: 24 G Needle length: 9 cm Assessment Sensory level: T4 Additional Notes Patient identified.  Risk benefits discussed including failed block, incomplete pain control, headache, nerve damage, paralysis, blood pressure changes, nausea, vomiting, reactions to medication both toxic or allergic, and postpartum back pain.  Patient expressed understanding and wished to proceed.  All questions were answered.  Sterile technique used throughout procedure.  CSF was clear.  No parasthesia or other complications.  Please see nursing notes for vital signs.

## 2013-08-24 NOTE — Op Note (Signed)
Cesarean Section Procedure Note  Indications: previous uterine incision kerr x3 or greater  Pre-operative Diagnosis: 39 week 0 day pregnancy.  Post-operative Diagnosis: same  Surgeon: Lenoard Aden   Assistants: Arita Miss, CNM  Anesthesia: Local anesthesia 0.25.% bupivacaine and Spinal anesthesia  ASA Class: 2  Procedure Details  The patient was seen in the Holding Room. The risks, benefits, complications, treatment options, and expected outcomes were discussed with the patient.  The patient concurred with the proposed plan, giving informed consent. The risks of anesthesia, infection, bleeding and possible injury to other organs discussed. Injury to bowel, bladder, or ureter with possible need for repair discussed. Possible need for transfusion with secondary risks of hepatitis or HIV acquisition discussed. Post operative complications to include but not limited to DVT, PE and Pneumonia noted. The site of surgery properly noted/marked. The patient was taken to Operating Room # 9, identified as KIMBALL MANSKE and the procedure verified as C-Section Delivery. A Time Out was held and the above information confirmed.  After induction of anesthesia, the patient was draped and prepped in the usual sterile manner. A Pfannenstiel incision was made and carried down through the subcutaneous tissue to the fascia. Fascial incision was made and extended transversely using Mayo scissors. The fascia was separated from the underlying rectus tissue superiorly and inferiorly. The peritoneum was identified and entered. Peritoneal incision was extended longitudinally. The utero-vesical peritoneal reflection was incised transversely and the bladder flap was bluntly freed from the lower uterine segment. A low transverse uterine incision(Kerr hysterotomy) was made. Delivered from vertex presentation with vacuum assistance  was a  female with Apgar scores of 9 at one minute and 9 at five minutes. Bulb suctioning gently  performed. Neonatal team in attendance.After the umbilical cord was clamped and cut cord blood was obtained for evaluation. The placenta was removed intact and appeared normal. The uterus was curetted with a dry lap pack. Good hemostasis was noted.The uterine outline, tubes and ovaries appeared normal. The uterine incision was closed with running locked sutures of 0 Monocryl x 2 layers. Hemostasis was observed.Modified Pomeroy technique to interrupt both tubes with 0- plain ties. The fascia was then reapproximated with running sutures of 0 Monocryl. 2-0 plain to close [place] layer.  The skin was reapproximated with staples.  Instrument, sponge, and needle counts were correct prior the abdominal closure and at the conclusion of the case.   Findings: Nl uterus , nl tubes, nl ovarie  Estimated Blood Loss:  300 mL         Drains: foley                 Specimens: placenta and tubal segments                 Complications:  None; patient tolerated the procedure well.         Disposition: PACU - hemodynamically stable.         Condition: stable  Attending Attestation: I performed the procedure.

## 2013-08-24 NOTE — Anesthesia Preprocedure Evaluation (Signed)
Anesthesia Evaluation  Patient identified by MRN, date of birth, ID band Patient awake    Reviewed: Allergy & Precautions, H&P , NPO status , Patient's Chart, lab work & pertinent test results  Airway Mallampati: II      Dental no notable dental hx.    Pulmonary neg pulmonary ROS,  breath sounds clear to auscultation  Pulmonary exam normal       Cardiovascular Exercise Tolerance: Good negative cardio ROS  Rhythm:regular Rate:Normal     Neuro/Psych PSYCHIATRIC DISORDERS negative neurological ROS  negative psych ROS   GI/Hepatic negative GI ROS, Neg liver ROS, GERD-  Controlled,  Endo/Other  negative endocrine ROS  Renal/GU negative Renal ROS  negative genitourinary   Musculoskeletal   Abdominal Normal abdominal exam  (+)   Peds  Hematology negative hematology ROS (+)   Anesthesia Other Findings   Reproductive/Obstetrics (+) Pregnancy                           Anesthesia Physical Anesthesia Plan  ASA: II  Anesthesia Plan: Spinal   Post-op Pain Management:    Induction:   Airway Management Planned:   Additional Equipment:   Intra-op Plan:   Post-operative Plan:   Informed Consent: I have reviewed the patients History and Physical, chart, labs and discussed the procedure including the risks, benefits and alternatives for the proposed anesthesia with the patient or authorized representative who has indicated his/her understanding and acceptance.     Plan Discussed with: Anesthesiologist, CRNA and Surgeon  Anesthesia Plan Comments:         Anesthesia Quick Evaluation

## 2013-08-25 ENCOUNTER — Encounter (HOSPITAL_COMMUNITY): Payer: Self-pay | Admitting: Obstetrics and Gynecology

## 2013-08-25 LAB — BIRTH TISSUE RECOVERY COLLECTION (PLACENTA DONATION)

## 2013-08-25 LAB — CBC
MCH: 31.4 pg (ref 26.0–34.0)
MCHC: 34.1 g/dL (ref 30.0–36.0)
RDW: 13.2 % (ref 11.5–15.5)

## 2013-08-25 MED ORDER — OXYCODONE-ACETAMINOPHEN 5-325 MG PO TABS
1.0000 | ORAL_TABLET | Freq: Four times a day (QID) | ORAL | Status: DC | PRN
  Administered 2013-08-25 – 2013-08-26 (×5): 1 via ORAL
  Filled 2013-08-25 (×2): qty 1
  Filled 2013-08-25 (×2): qty 2
  Filled 2013-08-25: qty 1

## 2013-08-25 MED ORDER — OXYCODONE HCL 5 MG PO TABS
5.0000 mg | ORAL_TABLET | Freq: Once | ORAL | Status: AC
  Administered 2013-08-25: 5 mg via ORAL
  Filled 2013-08-25: qty 1

## 2013-08-25 MED ORDER — OXYCODONE HCL 5 MG PO TABS: 5.0000 mg | ORAL_TABLET | Freq: Four times a day (QID) | ORAL | Status: DC | PRN

## 2013-08-25 MED ORDER — OXYCODONE HCL 5 MG PO TABS: 5.0000 mg | ORAL_TABLET | ORAL | Status: DC | PRN

## 2013-08-25 NOTE — Progress Notes (Signed)
Patient ID: Sydney Goodwin, female   DOB: 12-Dec-1975, 37 y.o.   MRN: [id number] POD # 1  Subjective: Pt reports feeling sore, tired, but well/ Pain controlled with percocet and ibuprofen. **Pt denies any allergy to percocet and desires to resume percocet, along with ibuprofen** Tolerating po/ Foley d/c'ed/Voiding without problems/ No n/v/Flatus neg Activity: out of bed and ambulate Bleeding is light Newborn info:  Information for the patient's newborn:  Punam, Broussard [[id number]]  female  / circ to be performed later today by Dr Billy Coast Feeding: breast   Objective: VS: Blood pressure 115/73, pulse 76, temperature 97.6 F (36.4 C), temperature source Oral, resp. rate 18.    Intake/Output Summary (Last 24 hours) at 08/25/13 0844 Last data filed at 08/24/13 2307  Gross per 24 hour  Intake   4555 ml  Output   4500 ml  Net     55 ml      Recent Labs  08/22/13 1105 08/25/13 0540  WBC 7.9 7.7  HGB 10.5* 8.5*  HCT 30.6* 24.9*  PLT 243 189    Blood type: --/--/A POS, A POS (09/16 1105) Rubella: Immune (03/12 0000)    Physical Exam:  General: alert, cooperative and no distress CV: Regular rate and rhythm Resp: clear Abdomen: soft, nontender, normal bowel sounds Incision: Covered with Tegaderm and honeycomb dressing; well approximated. Uterine Fundus: firm, below umbilicus, nontender Lochia: moderate Ext: edema +1 and Homans sign is negative, no sign of DVT    A/P: POD # 1/ [id number] ABL Anemia, will start iron supplement tomorrow S/P C/Section d/t elective repeat with BTL Doing well Continue routine post op orders   Signed: Demetrius Revel, MSN, [facility] 08/25/2013, 8:44 AM

## 2013-08-26 MED ORDER — IBUPROFEN 600 MG PO TABS: 600.0000 mg | ORAL_TABLET | Freq: Four times a day (QID) | ORAL | Status: DC

## 2013-08-26 MED ORDER — POLYSACCHARIDE IRON COMPLEX 150 MG PO CAPS: 150.0000 mg | ORAL_CAPSULE | Freq: Two times a day (BID) | ORAL | Status: DC

## 2013-08-26 MED ORDER — OXYCODONE-ACETAMINOPHEN 5-325 MG PO TABS: 1.0000 | ORAL_TABLET | Freq: Four times a day (QID) | ORAL | Status: DC | PRN

## 2013-08-26 NOTE — Discharge Summary (Signed)
POST OPERATIVE DISCHARGE SUMMARY:  Patient ID: Sydney Goodwin MRN: [id number] DOB/AGE: 11-Sep-1976 37 y.o.  Admit date: 08/24/2013 Admission Diagnoses: 39.[redacted] wks gestation, undesired fertility   Discharge date: 08/26/13    Discharge Diagnoses: S/p repeat cesarean section and BTI, Anemia  Prenatal history: [id number]   EDC : 08/31/2013, by Other Basis  Prenatal care at [facility]  Primary provider : Dr. Billy Coast Prenatal course complicated by IDA, anxiety  Prenatal Labs: ABO, Rh: A (03/12 0000)  Antibody: NEG (09/16 1105) Rubella: Immune (03/12 0000)  RPR: NON REACTIVE (09/16 1105)  HBsAg: NEGATIVE (02/17 1510)  HIV: Non-reactive (03/12 0000)  GBS: Negative (08/25 0000)  GTT: 147, nml 3 hr gtt  Medical / Surgical History :  Past medical history:  Past Medical History  Diagnosis Date  . Anxiety   . Depression   . GERD (gastroesophageal reflux disease)   . Postpartum care following cesarean delivery (08/24/13) 08/24/2013  . Status post C-section 08/24/2013    Past surgical history:  Past Surgical History  Procedure Laterality Date  . Cesarean section    . Tonsillectomy    . Cesarean section with bilateral tubal ligation Bilateral 08/24/2013    Procedure: REPEAT CESAREAN SECTION WITH BILATERAL TUBAL LIGATION;  Surgeon: Lenoard Aden, MD;  Location: [facility];  Service: Obstetrics;  Laterality: Bilateral;    Family History: History reviewed. No pertinent family history.  Social History:  reports that she has never smoked. She does not have any smokeless tobacco history on file. She reports that she does not drink alcohol or use illicit drugs.   Allergies: Morphine and related; Hydromorphone; and Penicillins    Current Medications at time of admission:  Prior to Admission medications   Medication Sig Start Date End Date Taking? Authorizing Provider  Prenatal Vit-Fe Fumarate-FA (PRENATAL MULTIVITAMIN) TABS Take 1 tablet by mouth every morning.   Yes  Historical Provider, MD  ibuprofen (ADVIL,MOTRIN) 600 MG tablet Take 1 tablet (600 mg total) by mouth every 6 (six) hours. 08/26/13   Lawernce Pitts, CNM  iron polysaccharides (NIFEREX) 150 MG capsule Take 1 capsule (150 mg total) by mouth 2 (two) times daily. 08/26/13   Lawernce Pitts, CNM  oxyCODONE-acetaminophen (PERCOCET/ROXICET) 5-325 MG per tablet Take 1-2 tablets by mouth every 6 (six) hours as needed. 08/26/13   Lawernce Pitts, CNM     Intrapartum Course: scheduled repeat CS   Procedures: Cesarean section delivery of female newborn by Dr Billy Coast on 08/24/13 See operative report for further details APGAR (1 MIN): 9   APGAR (5 MINS): 9     Postoperative / postpartum course: complicated by ABL anemia  Physical Exam:   VSS: Temp:  [98.2 F (36.8 C)-98.5 F (36.9 C)] 98.2 F (36.8 C) (09/20 0617) Pulse Rate:  [83-92] 92 (09/20 0617) Resp:  [18] 18 (09/20 0617) BP: (125-130)/(76-77) 130/77 mmHg (09/20 0617) SpO2:  [99 %] 99 % (09/20 0617)  LABS:  Recent Labs  08/25/13 0540  WBC 7.7  HGB 8.5*  PLT 189    General: alert and oriented x3 Heart: RRR Lungs: clear  Abdomen: soft and non-tender / non-distended / active BS  Extremities: no edema / negative Homans  Dressing: honeycomb dressing with scant old drainage Incision:  approximated with staples / no erythema / no ecchymosis / no drainage  Discharge Instructions:  Discharged Condition: good Activity: pelvic rest and postoperative restrictions x 2 weeks Diet: routine Medications: see below   Medication List    STOP  taking these medications       diphenhydrAMINE 25 MG tablet  Commonly known as:  BENADRYL     ranitidine 150 MG tablet  Commonly known as:  ZANTAC      TAKE these medications       ibuprofen 600 MG tablet  Commonly known as:  ADVIL,MOTRIN  Take 1 tablet (600 mg total) by mouth every 6 (six) hours.     iron polysaccharides 150 MG capsule  Commonly known as:  NIFEREX  Take 1 capsule  (150 mg total) by mouth 2 (two) times daily.     oxyCODONE-acetaminophen 5-325 MG per tablet  Commonly known as:  PERCOCET/ROXICET  Take 1-2 tablets by mouth every 6 (six) hours as needed.     prenatal multivitamin Tabs tablet  Take 1 tablet by mouth every morning.       Wound Care: Keep dressing clean and dry, remove in 2-3 days Clean incision with warm water and dry thoroughly Call the office for redness, swelling, or drainage from incision  Postpartum Instructions: [facility] discharge booklet - instructions reviewed Discharge to: Home  Follow up : [facility] in 6 weeks for routine postpartum visit and 08/31/13 for staple removal                      Signed: Donette Larry, N MSN, CNM 08/26/2013, 11:16 AM

## 2013-08-26 NOTE — Lactation Note (Addendum)
This note was copied from the chart of Sydney Saory Carriero. Lactation Consultation Note: Mom reports that baby is nursing well but having some trouble getting latched to right nipple because it is a little flat. Reports that her other boys had trouble with that nipple too. Shells and manual pump given with instructions for use and cleaning. Encouraged mom to rub EBM into nipples after nursing. Reports that breasts are feeling fuller today. Plans to borrow pump from a friend. Encouraged to call insurance company to see what they will cover. No further questions at present. To call prn  Patient Name: Sydney Goodwin [id number] Date: 08/26/2013 Reason for consult: Follow-up assessment   Maternal Data    Feeding    LATCH Score/Interventions       Type of Nipple: Flat (right) Intervention(s): Hand pump;Shells  Comfort (Breast/Nipple): Filling, red/small blisters or bruises, mild/mod discomfort  Problem noted: Mild/Moderate discomfort        Lactation Tools Discussed/Used Pump Review: Setup, frequency, and cleaning Initiated by:: DW Date initiated:: 08/26/13   Consult Status Consult Status: Complete    Pamelia Hoit 08/26/2013, 9:38 AM

## 2013-08-26 NOTE — Progress Notes (Signed)
POD # 2  Subjective: Pt reports feeling good/ Pain controlled with Motrin and Percocet Tolerating po/Voiding without problems/ No n/v/ Flatus present Activity: ad lib Bleeding is light Newborn info:  Information for the patient's newborn:  [name], [name] [[id number]]  female  / Circumcision: done/ Feeding: breast   Objective: VS: VS:  Filed Vitals:   08/25/13 0100 08/25/13 0614 08/25/13 1824 08/26/13 0617  BP: 123/79 115/73 125/76 130/77  Pulse: 90 76 83 92  Temp: 98 F (36.7 C) 97.6 F (36.4 C) 98.5 F (36.9 C) 98.2 F (36.8 C)  TempSrc: Oral Oral Axillary Oral  Resp: 18 18 18 18   Height:      Weight:      SpO2: 98% 99%  99%    I&O: Intake/Output     09/19 0701 - 09/20 0700 09/20 0701 - 09/21 0700   P.O.     I.V. (mL/kg)     Total Intake(mL/kg)     Urine (mL/kg/hr)     Blood     Total Output       Net              LABS:  Recent Labs  08/25/13 0540  WBC 7.7  HGB 8.5*  PLT 189                           Physical Exam:  General: alert and cooperative CV: Regular rate and rhythm Resp: clear Abdomen: soft, nontender, normal bowel sounds Incision: healing well, no erythema, no hernia, no seroma, no swelling, well approximated, staples intact. Honeycomb dressing with old brown drainage Uterine Fundus: firm, below umbilicus, nontender Lochia: minimal Ext: extremities normal, atraumatic, no cyanosis or edema and Homans sign is negative, no sign of DVT  No SOB, chest pain, or dizziness   Assessment: POD # 2/ [id number]/ S/P C/Section d/t repeat S/p BTI IDA with compounding ABL anemia Doing well and stable for discharge home  Plan: Discharge home RX's: Ibuprofen 600mg  po Q 6 hrs prn pain #30 Refill x 0 Percocet 5/325 1 - 2 tabs po every 6 hrs prn pain  #30 No refill Niferex 150mg  po BID #60 Refill x 1 Follow up with [facility] in 6 wks for postpartum visit Follow up with [facility] on 08/31/13 for staple removal  Signed: Donette Larry, N, MSN, CNM 08/26/2013,  11:09 AM

## 2014-04-11 ENCOUNTER — Ambulatory Visit: Payer: Self-pay | Admitting: *Deleted

## 2014-04-23 ENCOUNTER — Encounter: Payer: Self-pay | Admitting: *Deleted

## 2014-04-24 ENCOUNTER — Ambulatory Visit (INDEPENDENT_AMBULATORY_CARE_PROVIDER_SITE_OTHER): Payer: Self-pay | Admitting: *Deleted

## 2014-04-24 DIAGNOSIS — I83893 Varicose veins of bilateral lower extremities with other complications: Secondary | ICD-10-CM

## 2014-04-24 DIAGNOSIS — I781 Nevus, non-neoplastic: Secondary | ICD-10-CM

## 2014-04-24 NOTE — Progress Notes (Signed)
Patient came in to see if sclerotherapy would be the best treatment of her varicose, reticular and spider veins. Her Right leg just has spider veins so sclero would be a good option, but the left leg has a collection of varicose veins behind the knee. I suggested an ultrasound of that leg and to see Dr. Trula Slade afterwards. An appt was made for two weeks from now. We discussed at length the treatments and insurance situation. Will follow prn.

## 2014-04-24 NOTE — Addendum Note (Signed)
Addended by: Mena Goes on: 04/24/2014 02:36 PM   Modules accepted: Orders

## 2014-04-24 NOTE — Addendum Note (Signed)
Addended by: Mena Goes on: 04/24/2014 02:24 PM   Modules accepted: Orders

## 2014-05-04 ENCOUNTER — Encounter: Payer: Self-pay | Admitting: Surgery

## 2014-05-07 ENCOUNTER — Ambulatory Visit (INDEPENDENT_AMBULATORY_CARE_PROVIDER_SITE_OTHER): Payer: BC Managed Care – PPO | Admitting: Surgery

## 2014-05-07 ENCOUNTER — Encounter: Payer: Self-pay | Admitting: Surgery

## 2014-05-07 ENCOUNTER — Ambulatory Visit (HOSPITAL_COMMUNITY)
Admission: RE | Admit: 2014-05-07 | Discharge: 2014-05-07 | Disposition: A | Discharge status: Home / Self Care | Payer: BC Managed Care – PPO | Source: Ambulatory Visit | Attending: Surgery | Admitting: Surgery

## 2014-05-07 VITALS — BP 125/78 | HR 108 | Resp 16 | Ht 65.0 in | Wt 173.0 lb

## 2014-05-07 DIAGNOSIS — I83893 Varicose veins of bilateral lower extremities with other complications: Secondary | ICD-10-CM

## 2014-05-07 NOTE — Progress Notes (Signed)
Patient name: Sydney Goodwin MRN: [id number] DOB: 1976-05-30 Sex: female   Referred by: Self  Reason for referral:  Chief Complaint  Patient presents with  . New Evaluation    C/O   Left varicose veins, duration 1 + yrs.      HISTORY OF PRESENT ILLNESS: 38 year old with history of spider veins comes in today for further evaluation.  She reports an area behind her left knee which becomes itchy and tender.  She states the worst area of spider veins on the left lateral knee.  She denies any swelling.  She does get pain in her legs with walking.  Past Medical History  Diagnosis Date  . Anxiety   . Depression   . GERD (gastroesophageal reflux disease)   . Postpartum care following cesarean delivery (08/24/13) 08/24/2013  . Status post C-section 08/24/2013  . Varicose veins     Left Leg    Past Surgical History  Procedure Laterality Date  . Cesarean section    . Tonsillectomy    . Cesarean section with bilateral tubal ligation Bilateral 08/24/2013    Procedure: REPEAT CESAREAN SECTION WITH BILATERAL TUBAL LIGATION;  Surgeon: Lovenia Kim, MD;  Location: [facility];  Service: Obstetrics;  Laterality: Bilateral;    History   Social History  . Marital Status: Married    Spouse Name: N/A    Number of Children: N/A  . Years of Education: N/A   Occupational History  . Not on file.   Social History Main Topics  . Smoking status: Never Smoker   . Smokeless tobacco: Never Used  . Alcohol Use: No  . Drug Use: No  . Sexual Activity: Yes   Other Topics Concern  . Not on file   Social History Narrative  . No narrative on file    Family History  Problem Relation Age of Onset  . Varicose Veins Mother   . Hyperlipidemia Father   . Varicose Veins Father     Allergies as of 05/07/2014 - Review Complete 05/07/2014  Allergen Reaction Noted  . Morphine and related Itching 08/16/2013  . Hydromorphone  08/16/2013  . Penicillins Itching and Rash 01/23/2013    Current  Outpatient Prescriptions on File Prior to Visit  Medication Sig Dispense Refill  . ibuprofen (ADVIL,MOTRIN) 600 MG tablet Take 1 tablet (600 mg total) by mouth every 6 (six) hours.  30 tablet  0  . iron polysaccharides (NIFEREX) 150 MG capsule Take 1 capsule (150 mg total) by mouth 2 (two) times daily.  60 capsule  1  . oxyCODONE-acetaminophen (PERCOCET/ROXICET) 5-325 MG per tablet Take 1-2 tablets by mouth every 6 (six) hours as needed.  30 tablet  0  . Prenatal Vit-Fe Fumarate-FA (PRENATAL MULTIVITAMIN) TABS Take 1 tablet by mouth every morning.       No current facility-administered medications on file prior to visit.     REVIEW OF SYSTEMS: Please see history of present illness, otherwise all systems negative  PHYSICAL EXAMINATION: General: The patient appears their stated age.  Vital signs are BP 125/78  Pulse 108  Resp 16  Ht 5\' 5"  (1.651 m)  Wt 173 lb (78.472 kg)  BMI 28.79 kg/m2  SpO2 100% HEENT:  No gross abnormalities Pulmonary: Respirations are non-labored Skin: There are no ulcer or rashes noted. Psychiatric: The patient has normal affect. Cardiovascular: Spider veins, most prominent on the left leg in the popliteal fossa, the left lateral knee and lower leg.  Diagnostic Studies:  Duplex ultrasound was ordered and reviewed.  There is no deep vein obstruction or reflux.  The saphenous vein is without reflux.  There is a short segment area of reflux in the short saphenous vein.   Assessment:  Spider veins, left leg Plan: No significant reflux is noted within the deeper superficial venous system.  I have recommended sclerotherapy for treatment of her spider veins.  She will contact our office to have this arranged, likely in the fall.  Given her a prescription for 20-30 thigh-high compression stockings     V. Leia Alf, M.D. [facility] Office: [phone] Pager:  [phone]

## 2014-08-31 ENCOUNTER — Telehealth: Payer: Self-pay | Admitting: Family Medicine

## 2014-08-31 DIAGNOSIS — F411 Generalized anxiety disorder: Secondary | ICD-10-CM

## 2014-08-31 MED ORDER — VENLAFAXINE HCL ER 37.5 MG PO CP24: 37.5000 mg | ORAL_CAPSULE | Freq: Every day | ORAL | Status: DC

## 2014-08-31 NOTE — Telephone Encounter (Signed)
Received notice from pt that she needs her effexor refilled- will do this for her. Did ask her to come in soon for an OV as we need to see her.  She will do so.  She is stable on her current dose of effexor and doing well with this.  No longer nursing her child who was born just over a year ago

## 2014-09-15 IMAGING — US US ABDOMEN COMPLETE
1 series · 14 of 25 positions shown · non-contrast
Comparison: None

CLINICAL DATA: Elevated liver function tests.

ABDOMEN ULTRASOUND
TECHNIQUE: Complete abdominal ultrasound examination was performed
including evaluation of the liver, gallbladder, bile ducts,
pancreas, kidneys, spleen, IVC, and abdominal aorta.

[Series 1: us abdomen complete · 0.33mm/px · 14 of 70 slices shown]
[im 1/70]
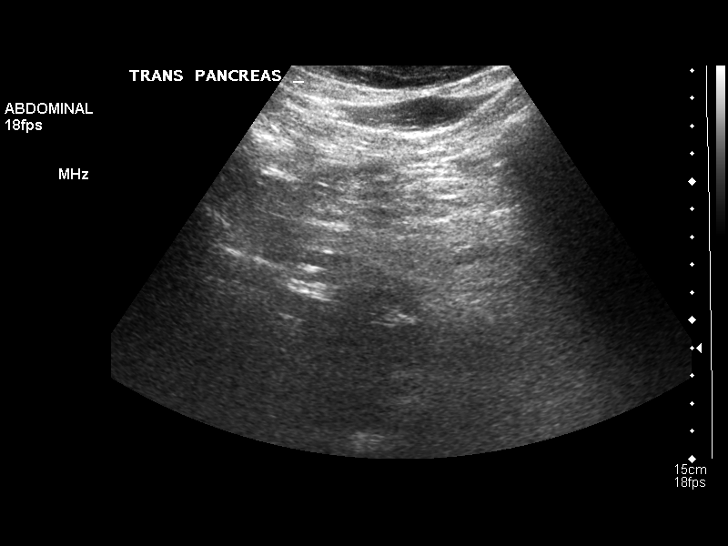
[im 6/70]
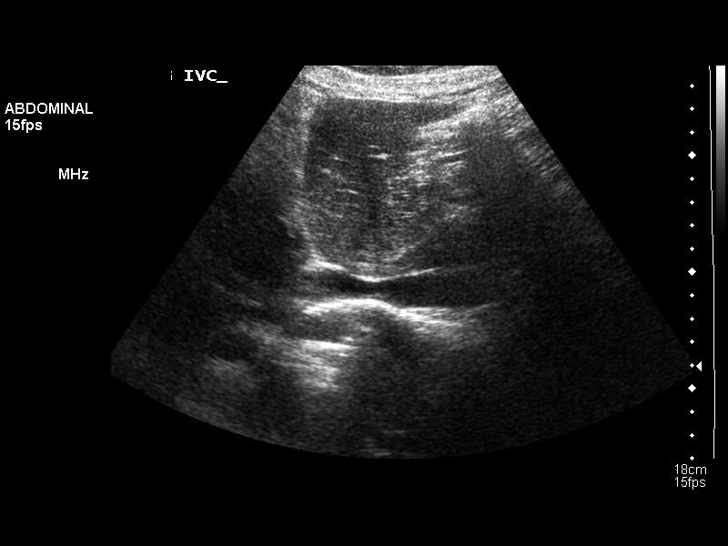
[im 12/70]
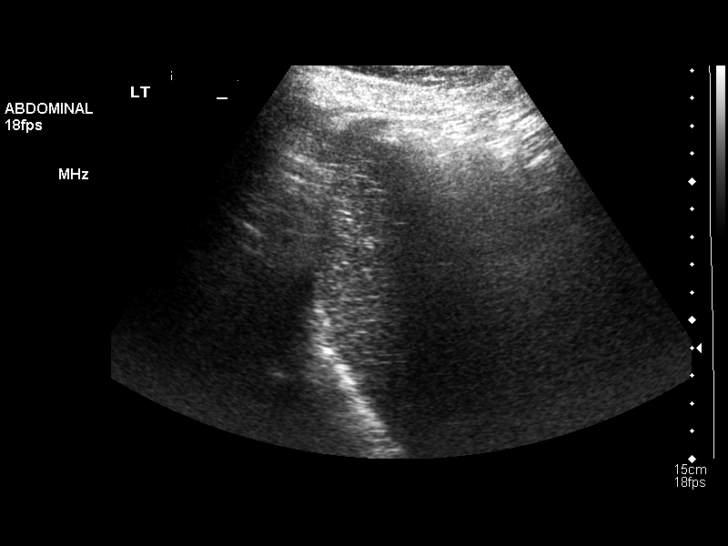
[im 18/70]
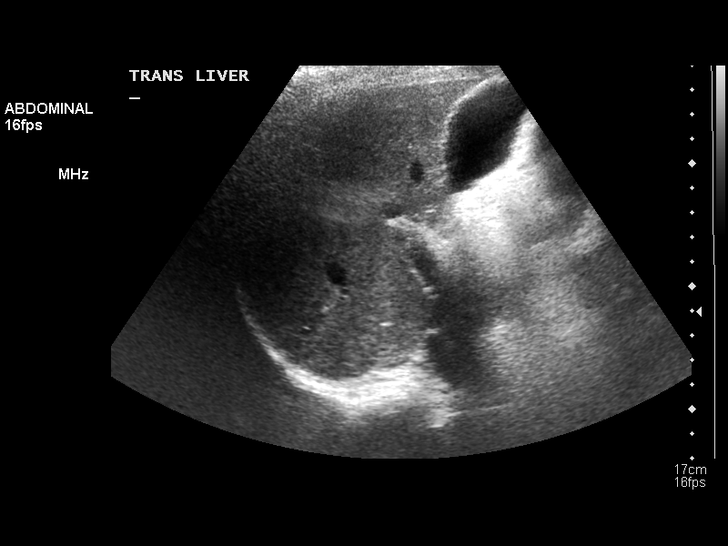
[im 24/70]
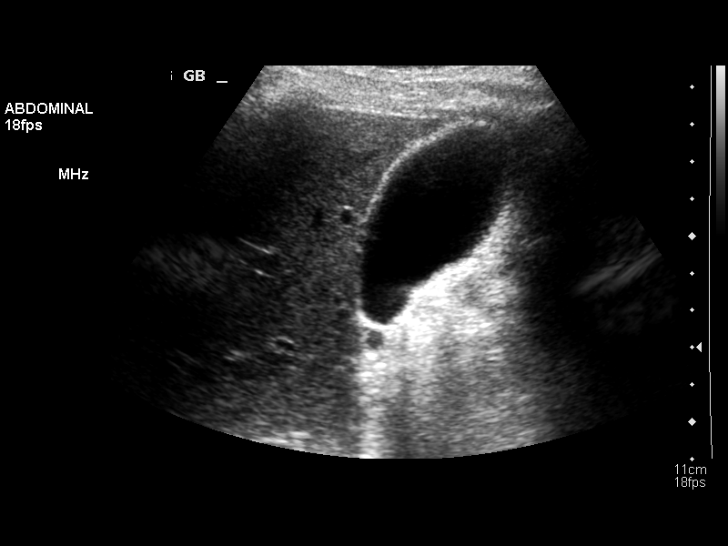
[im 26/70]
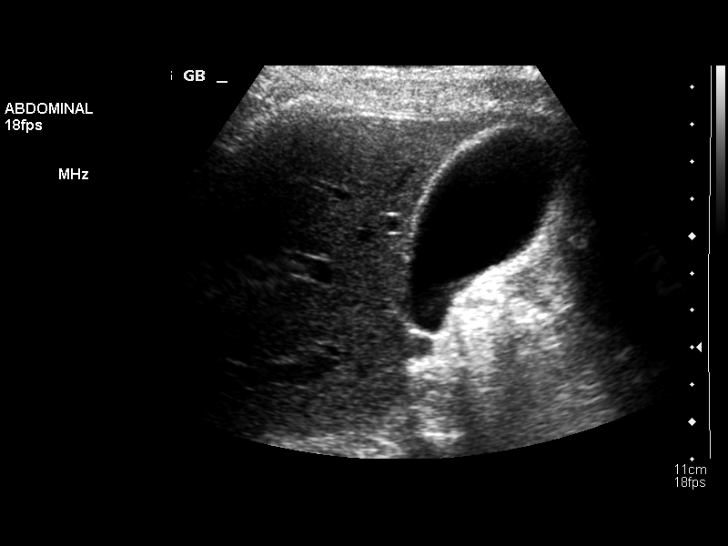
[im 32/70]
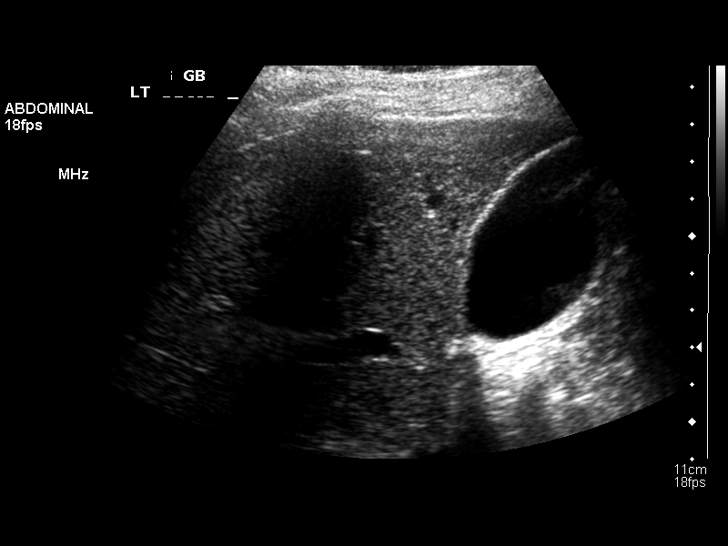
[im 38/70]
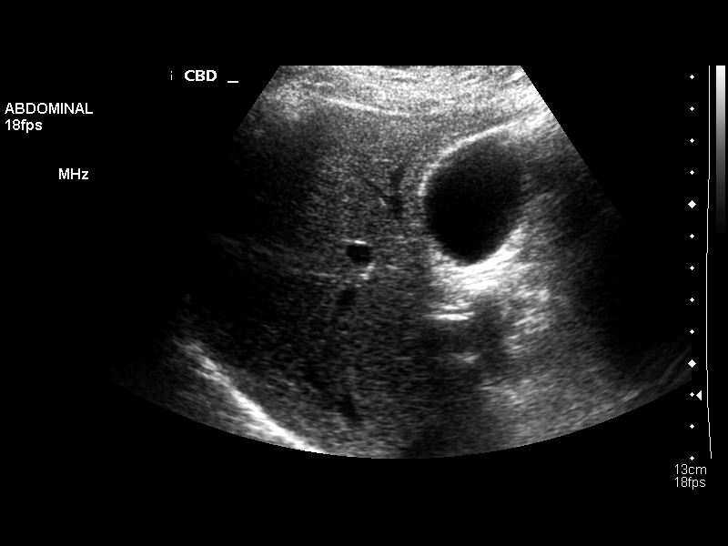
[im 44/70]
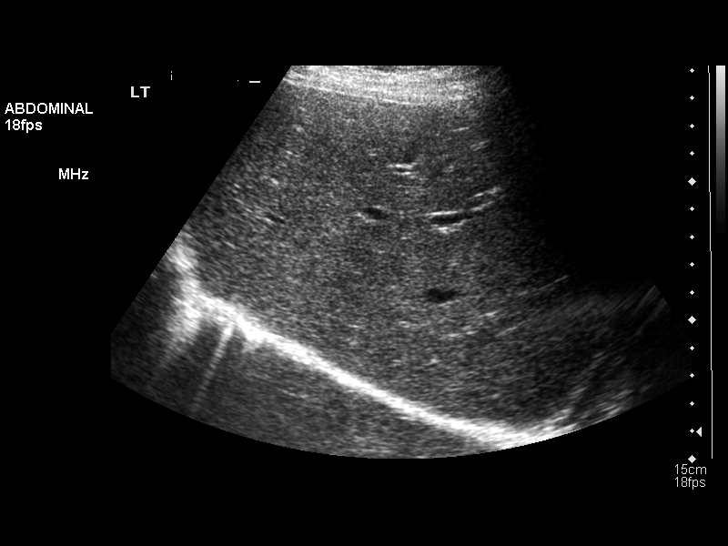
[im 47/70]
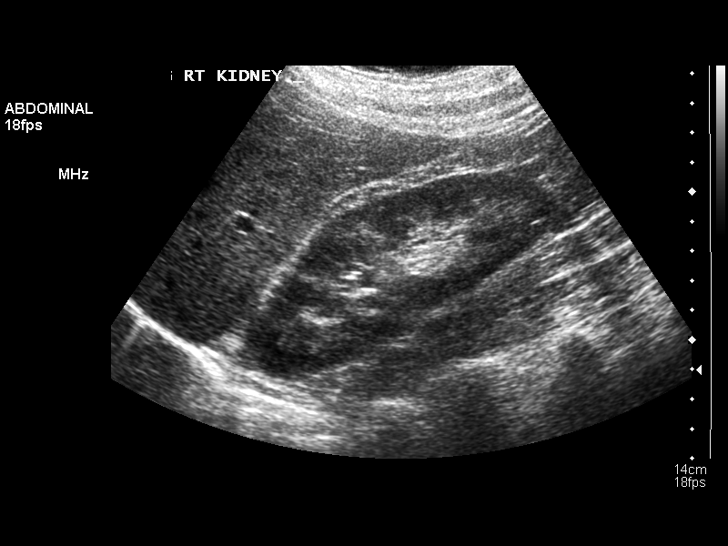
[im 52/70]
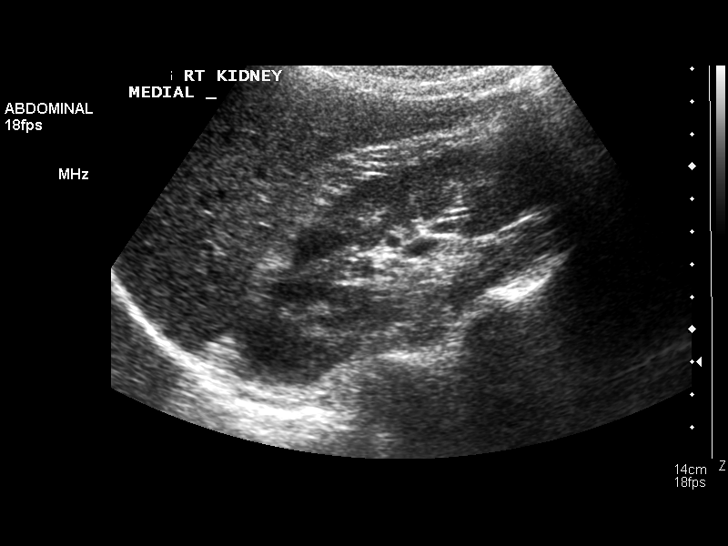
[im 58/70]
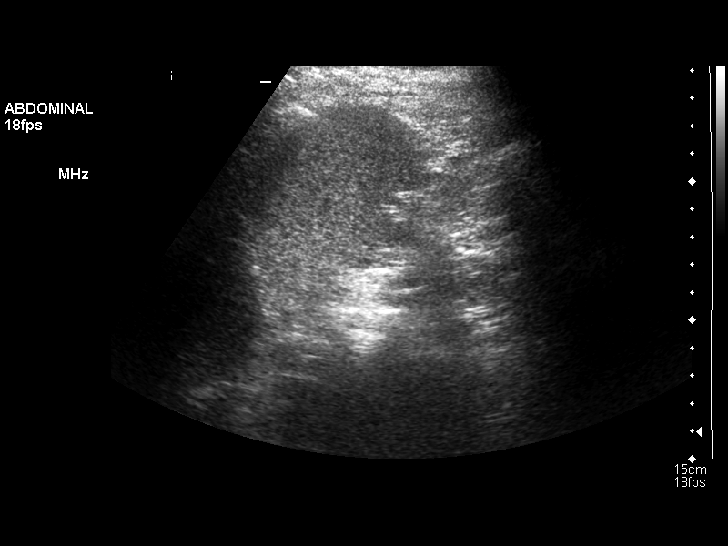
[im 64/70]
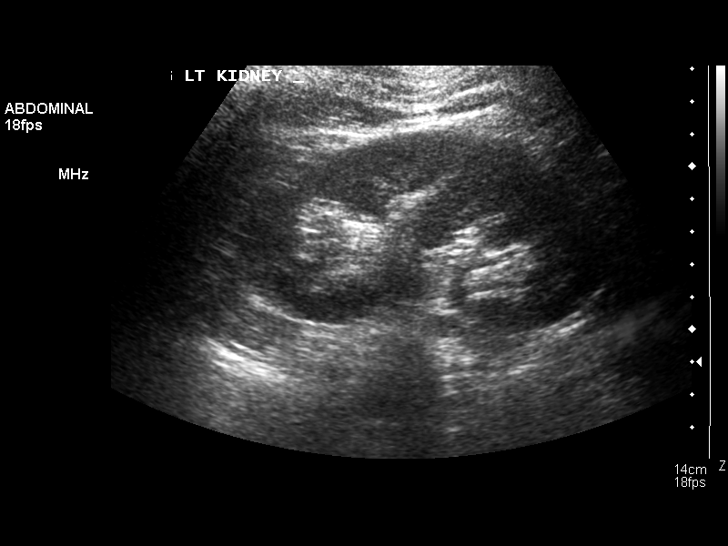
[im 70/70]
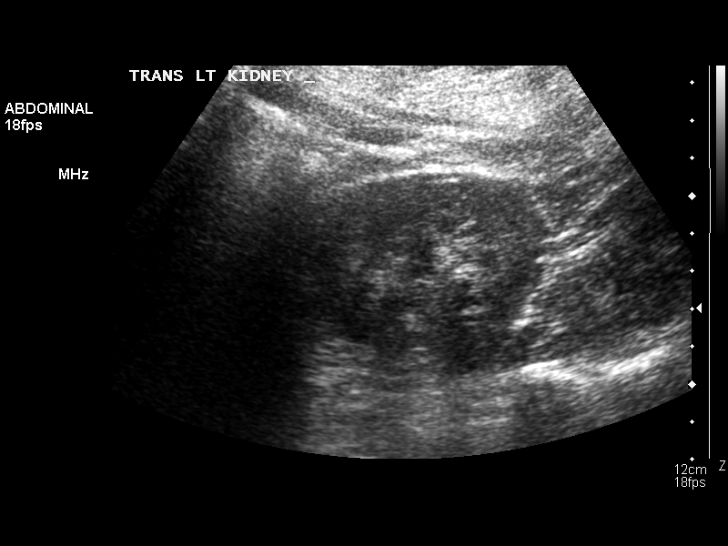

[14 of 25 positions shown; findings below may reference images not displayed]

FINDINGS: Gallbladder:  No shadowing gallstones or echogenic sludge.  No
gallbladder wall thickening or pericholecystic fluid.  Negative
sonographic Murphy's sign according to the ultrasound technologist.

Common Bile Duct:  Normal caliber of 3 mm.

Liver:  Normal size and echotexture without focal parenchymal
abnormality.  Patent portal vein with hepatopetal flow.

IVC:  Patent throughout its visualized course in the abdomen.

Pancreas:  Although the pancreas is difficult to visualize in its
entirety, no focal pancreatic abnormality is identified.

Spleen:  Normal echotexture and size.

Kidneys:  The right kidney measures 11.6 cm and the left kidney
11.7 cm.  Both have a normal sonographic appearance.

Abdominal Aorta:  Normal caliber aorta.
IMPRESSION: Normal abdominal ultrasound.

## 2014-10-08 ENCOUNTER — Encounter: Payer: Self-pay | Admitting: Surgery

## 2015-02-15 ENCOUNTER — Other Ambulatory Visit: Payer: Self-pay | Admitting: Family Medicine

## 2015-05-29 ENCOUNTER — Other Ambulatory Visit: Payer: Self-pay | Admitting: Family Medicine

## 2015-05-30 NOTE — Telephone Encounter (Signed)
Tried to call pt to discuss OV plans. We haven't seen this pt at all in [facility]. She was told by Dr Lorelei Pont last Sept that she needed to RTC and was given RF. Then gave 30 day RF in March w/note needs OV for more. VM on pt's phone was full and could not LM. I will deny w/note that says pt needs to contact provider so that she will call us and then we can discuss f/up.

## 2016-04-29 DIAGNOSIS — Z1231 Encounter for screening mammogram for malignant neoplasm of breast: Secondary | ICD-10-CM | POA: Diagnosis not present

## 2016-04-29 DIAGNOSIS — Z01419 Encounter for gynecological examination (general) (routine) without abnormal findings: Secondary | ICD-10-CM | POA: Diagnosis not present

## 2016-04-29 DIAGNOSIS — Z1151 Encounter for screening for human papillomavirus (HPV): Secondary | ICD-10-CM | POA: Diagnosis not present

## 2016-04-29 DIAGNOSIS — Z6830 Body mass index (BMI) 30.0-30.9, adult: Secondary | ICD-10-CM | POA: Diagnosis not present

## 2016-04-29 DIAGNOSIS — R875 Abnormal microbiological findings in specimens from female genital organs: Secondary | ICD-10-CM | POA: Diagnosis not present

## 2017-01-13 ENCOUNTER — Encounter (INDEPENDENT_AMBULATORY_CARE_PROVIDER_SITE_OTHER): Payer: Self-pay

## 2017-01-13 ENCOUNTER — Ambulatory Visit (INDEPENDENT_AMBULATORY_CARE_PROVIDER_SITE_OTHER): Payer: BLUE CROSS/BLUE SHIELD

## 2017-01-13 ENCOUNTER — Ambulatory Visit (INDEPENDENT_AMBULATORY_CARE_PROVIDER_SITE_OTHER): Payer: BLUE CROSS/BLUE SHIELD | Admitting: Orthopaedic Surgery

## 2017-01-13 DIAGNOSIS — M25572 Pain in left ankle and joints of left foot: Secondary | ICD-10-CM

## 2017-01-13 MED ORDER — NAPROXEN 500 MG PO TABS: 500.0000 mg | ORAL_TABLET | Freq: Two times a day (BID) | ORAL | 3 refills | Status: DC

## 2017-01-13 NOTE — Progress Notes (Signed)
Office Visit Note   Patient: Sydney Goodwin           Date of Birth: 21-Mar-1976           MRN: [id number] Visit Date: 01/13/2017              Requested by: No referring provider defined for this encounter. PCP: No PCP Per Patient   Assessment & Plan: Visit Diagnoses:  1. Pain in left ankle and joints of left foot     Plan: Moderate to severe sprain. ASO, out of tennis for 2 weeks then resume as tolerated.  RICE. Naprosyn.  If not better, consider PT  Follow-Up Instructions: Return if symptoms worsen or fail to improve.   Orders:  Orders Placed This Encounter  Procedures  . XR Ankle Complete Left   Meds ordered this encounter  Medications  . naproxen (NAPROSYN) 500 MG tablet    Sig: Take 1 tablet (500 mg total) by mouth 2 (two) times daily with a meal.    Dispense:  30 tablet    Refill:  3      Procedures: No procedures performed   Clinical Data: No additional findings.   Subjective: Chief Complaint  Patient presents with  . Left Ankle - Injury, Pain    40 yo female acute left ankle sprain this morning playing tennis.  She stepped on ball and twisted her ankle.  She has more pain weight bearing and when plantarflexing ankle.  Has taken 1 aleve prior to coming to office.  Pain doesn't radiate.      Review of Systems  Constitutional: Negative.   HENT: Negative.   Eyes: Negative.   Respiratory: Negative.   Cardiovascular: Negative.   Endocrine: Negative.   Musculoskeletal: Negative.   Neurological: Negative.   Hematological: Negative.   Psychiatric/Behavioral: Negative.   All other systems reviewed and are negative.    Objective: Vital Signs: There were no vitals taken for this visit.  Physical Exam  Constitutional: She is oriented to person, place, and time. She appears well-developed and well-nourished.  HENT:  Head: Normocephalic and atraumatic.  Eyes: EOM are normal.  Neck: Neck supple.  Pulmonary/Chest: Effort normal.  Abdominal:  Soft.  Neurological: She is alert and oriented to person, place, and time.  Skin: Skin is warm. Capillary refill takes less than 2 seconds.  Psychiatric: She has a normal mood and affect. Her behavior is normal. Judgment and thought content normal.  Nursing note and vitals reviewed.   Left Ankle Exam  Swelling: moderate  Tenderness  The patient is experiencing tenderness in the ATF and lateral malleolus.   Range of Motion  The patient has normal left ankle ROM.   Muscle Strength  The patient has normal left ankle strength.  Tests  Anterior drawer: negative Varus tilt: negative  Other  Erythema: present Sensation: normal Pulse: present      Specialty Comments:  No specialty comments available.  Imaging: No results found.   PMFS History: Patient Active Problem List   Diagnosis Date Noted  . Postpartum care following cesarean delivery (08/24/13) 08/24/2013  . Status post C-section 08/24/2013   Past Medical History:  Diagnosis Date  . Anxiety   . Depression   . GERD (gastroesophageal reflux disease)   . Postpartum care following cesarean delivery (08/24/13) 08/24/2013  . Status post C-section 08/24/2013  . Varicose veins    Left Leg    Family History  Problem Relation Age of Onset  . Varicose Veins Mother   .  Hyperlipidemia Father   . Varicose Veins Father     Past Surgical History:  Procedure Laterality Date  . CESAREAN SECTION    . CESAREAN SECTION WITH BILATERAL TUBAL LIGATION Bilateral 08/24/2013   Procedure: REPEAT CESAREAN SECTION WITH BILATERAL TUBAL LIGATION;  Surgeon: Lovenia Kim, MD;  Location: [facility];  Service: Obstetrics;  Laterality: Bilateral;  . TONSILLECTOMY     Social History   Occupational History  . Not on file.   Social History Main Topics  . Smoking status: Never Smoker  . Smokeless tobacco: Never Used  . Alcohol use No  . Drug use: No  . Sexual activity: Yes

## 2017-04-14 DIAGNOSIS — N920 Excessive and frequent menstruation with regular cycle: Secondary | ICD-10-CM | POA: Diagnosis not present

## 2017-04-19 ENCOUNTER — Other Ambulatory Visit: Payer: Self-pay | Admitting: Obstetrics and Gynecology

## 2017-04-20 ENCOUNTER — Encounter (HOSPITAL_COMMUNITY): Payer: Self-pay

## 2017-04-21 DIAGNOSIS — L821 Other seborrheic keratosis: Secondary | ICD-10-CM | POA: Diagnosis not present

## 2017-04-21 DIAGNOSIS — L718 Other rosacea: Secondary | ICD-10-CM | POA: Diagnosis not present

## 2017-04-21 DIAGNOSIS — D225 Melanocytic nevi of trunk: Secondary | ICD-10-CM | POA: Diagnosis not present

## 2017-04-23 ENCOUNTER — Encounter (HOSPITAL_COMMUNITY): Admission: RE | Payer: Self-pay | Source: Ambulatory Visit

## 2017-04-23 ENCOUNTER — Ambulatory Visit (HOSPITAL_COMMUNITY)
Admission: RE | Admit: 2017-04-23 | Payer: BLUE CROSS/BLUE SHIELD | Source: Ambulatory Visit | Admitting: Obstetrics and Gynecology

## 2017-04-23 SURGERY — DILATATION & CURETTAGE/HYSTEROSCOPY WITH NOVASURE ABLATION
Anesthesia: Choice

## 2017-10-15 DIAGNOSIS — Z01419 Encounter for gynecological examination (general) (routine) without abnormal findings: Secondary | ICD-10-CM | POA: Diagnosis not present

## 2017-10-15 DIAGNOSIS — Z1231 Encounter for screening mammogram for malignant neoplasm of breast: Secondary | ICD-10-CM | POA: Diagnosis not present

## 2017-10-15 DIAGNOSIS — Z6831 Body mass index (BMI) 31.0-31.9, adult: Secondary | ICD-10-CM | POA: Diagnosis not present

## 2017-11-04 ENCOUNTER — Encounter (HOSPITAL_COMMUNITY): Payer: Self-pay

## 2017-11-04 ENCOUNTER — Other Ambulatory Visit: Payer: Self-pay

## 2017-11-09 ENCOUNTER — Other Ambulatory Visit: Payer: Self-pay | Admitting: Obstetrics and Gynecology

## 2017-11-24 NOTE — H&P (Signed)
NAMETINIE, MCGLOIN               ACCOUNT NO.:  [id number]  MEDICAL RECORD NO.:  [id number]  LOCATION:                                 FACILITY:  PHYSICIAN:  Lovenia Kim, M.D.     DATE OF BIRTH:  DATE OF ADMISSION: DATE OF DISCHARGE:                             HISTORY & PHYSICAL   CHIEF COMPLAINT:  Refractory menometrorrhagia.  HISTORY OF PRESENT ILLNESS:  A 41 year old white female, G4, P4, history of C-section x4, with refractory menorrhagia for definitive therapy.  MEDICATIONS:  Effexor.  SOCIAL HISTORY:  Nonsmoker, nondrinker.  Denies domestic or physical violence.  GYNECOLOGIC HISTORY:  History of C-section x4.  FAMILY HISTORY:  Kidney and lung disease, colon cancer, diabetes, and varicosities.  PHYSICAL EXAMINATION:  GENERAL:  Well-developed, well-nourished white female, in no acute distress. HEENT:  Normal. NECK:  Supple.  Full range of motion. LUNGS:  Clear. HEART:  Regular rate and rhythm. ABDOMEN:  Soft and nontender. PELVIC:  Reveals uterus to be bulky, retroflexed.  No adnexal masses. EXTREMITIES:  There are no cords. NEUROLOGIC:  Nonfocal. SKIN:  Intact.  IMPRESSION:  Refractory menometrorrhagia, for definitive therapy.  PLAN:  Proceed with diagnostic hysteroscopy, D and C, NovaSure endometrial ablation.  Risks of anesthesia, infection, bleeding, injury to surrounding organs, possible need for repair were discussed.  Delayed versus immediate complications to include bowel and bladder injury noted.  Failure risks of NovaSure were discussed.  The patient acknowledges and wishes to proceed.     Lovenia Kim, M.D.     RJT/MEDQ  D:  11/24/2017  T:  11/24/2017  Job:  [id number]  cc:   [facility]

## 2017-11-25 ENCOUNTER — Encounter (HOSPITAL_COMMUNITY)
Admission: AD | Disposition: A | Discharge status: Home / Self Care | Payer: Self-pay | Source: Ambulatory Visit | Attending: Obstetrics and Gynecology

## 2017-11-25 ENCOUNTER — Ambulatory Visit (HOSPITAL_COMMUNITY): Payer: BLUE CROSS/BLUE SHIELD | Admitting: Certified Registered"

## 2017-11-25 ENCOUNTER — Ambulatory Visit (HOSPITAL_COMMUNITY)
Admission: AD | Admit: 2017-11-25 | Discharge: 2017-11-25 | Disposition: A | Discharge status: Home / Self Care | Payer: BLUE CROSS/BLUE SHIELD | Source: Ambulatory Visit | Attending: Obstetrics and Gynecology | Admitting: Obstetrics and Gynecology

## 2017-11-25 ENCOUNTER — Encounter (HOSPITAL_COMMUNITY): Payer: Self-pay

## 2017-11-25 DIAGNOSIS — N921 Excessive and frequent menstruation with irregular cycle: Secondary | ICD-10-CM | POA: Insufficient documentation

## 2017-11-25 DIAGNOSIS — N854 Malposition of uterus: Secondary | ICD-10-CM | POA: Diagnosis not present

## 2017-11-25 DIAGNOSIS — N882 Stricture and stenosis of cervix uteri: Secondary | ICD-10-CM | POA: Diagnosis not present

## 2017-11-25 DIAGNOSIS — N920 Excessive and frequent menstruation with regular cycle: Secondary | ICD-10-CM | POA: Diagnosis not present

## 2017-11-25 HISTORY — PX: DILITATION & CURRETTAGE/HYSTROSCOPY WITH NOVASURE ABLATION: SHX5568

## 2017-11-25 HISTORY — DX: Headache: R51

## 2017-11-25 HISTORY — DX: Headache, unspecified: R51.9

## 2017-11-25 LAB — BASIC METABOLIC PANEL
ANION GAP: 8 (ref 5–15)
BUN: 15 mg/dL (ref 6–20)
CALCIUM: 8.9 mg/dL (ref 8.9–10.3)
CO2: 23 mmol/L (ref 22–32)
CREATININE: 0.56 mg/dL (ref 0.44–1.00)
Chloride: 107 mmol/L (ref 101–111)
GFR calc Af Amer: >60 mL/min (ref >60)
GFR calc non Af Amer: >60 mL/min (ref >60)
GLUCOSE: 103 mg/dL — AB (ref 65–99)
Potassium: 4.1 mmol/L (ref 3.5–5.1)
Sodium: 138 mmol/L (ref 135–145)

## 2017-11-25 LAB — CBC
HCT: 36.7 % (ref 36.0–46.0)
Hemoglobin: 12.4 g/dL (ref 12.0–15.0)
MCH: 30.3 pg (ref 26.0–34.0)
MCHC: 33.8 g/dL (ref 30.0–36.0)
MCV: 89.7 fL (ref 78.0–100.0)
PLATELETS: 288 10*3/uL (ref 150–400)
RBC: 4.09 MIL/uL (ref 3.87–5.11)
RDW: 12.6 % (ref 11.5–15.5)
WBC: 6 10*3/uL (ref 4.0–10.5)

## 2017-11-25 LAB — HCG, SERUM, QUALITATIVE: PREG SERUM: NEGATIVE

## 2017-11-25 SURGERY — DILATATION & CURETTAGE/HYSTEROSCOPY WITH NOVASURE ABLATION
Anesthesia: General | Site: Vagina

## 2017-11-25 MED ORDER — BUPIVACAINE HCL (PF) 0.25 % IJ SOLN
INTRAMUSCULAR | Status: DC | PRN
  Administered 2017-11-25: 20 mL

## 2017-11-25 MED ORDER — OXYCODONE-ACETAMINOPHEN 5-325 MG PO TABS
1.0000 | ORAL_TABLET | ORAL | Status: DC | PRN
  Administered 2017-11-25: 1 via ORAL

## 2017-11-25 MED ORDER — METOCLOPRAMIDE HCL 5 MG/ML IJ SOLN: 10.0000 mg | Freq: Once | INTRAMUSCULAR | Status: DC | PRN

## 2017-11-25 MED ORDER — SODIUM CHLORIDE 0.9 % IR SOLN
Status: DC | PRN
  Administered 2017-11-25: 3000 mL

## 2017-11-25 MED ORDER — MEPERIDINE HCL 25 MG/ML IJ SOLN: 6.2500 mg | INTRAMUSCULAR | Status: DC | PRN

## 2017-11-25 MED ORDER — SCOPOLAMINE 1 MG/3DAYS TD PT72
1.0000 | MEDICATED_PATCH | Freq: Once | TRANSDERMAL | Status: DC
  Administered 2017-11-25: 1.5 mg via TRANSDERMAL

## 2017-11-25 MED ORDER — PROPOFOL 10 MG/ML IV BOLUS
INTRAVENOUS | Status: DC | PRN
  Administered 2017-11-25: 50 mg via INTRAVENOUS
  Administered 2017-11-25: 150 mg via INTRAVENOUS

## 2017-11-25 MED ORDER — ONDANSETRON HCL 4 MG/2ML IJ SOLN
INTRAMUSCULAR | Status: AC
  Filled 2017-11-25: qty 2

## 2017-11-25 MED ORDER — LIDOCAINE HCL (CARDIAC) 20 MG/ML IV SOLN
INTRAVENOUS | Status: AC
  Filled 2017-11-25: qty 5

## 2017-11-25 MED ORDER — EPHEDRINE SULFATE-NACL 50-0.9 MG/10ML-% IV SOSY
PREFILLED_SYRINGE | INTRAVENOUS | Status: DC | PRN
  Administered 2017-11-25: 5 mg via INTRAVENOUS

## 2017-11-25 MED ORDER — SODIUM CHLORIDE 0.9 % IJ SOLN
INTRAMUSCULAR | Status: AC
  Filled 2017-11-25: qty 50

## 2017-11-25 MED ORDER — VASOPRESSIN 20 UNIT/ML IV SOLN
INTRAVENOUS | Status: AC
  Filled 2017-11-25: qty 1

## 2017-11-25 MED ORDER — OXYCODONE-ACETAMINOPHEN 5-325 MG PO TABS
ORAL_TABLET | ORAL | Status: AC
  Filled 2017-11-25: qty 1

## 2017-11-25 MED ORDER — DEXAMETHASONE SODIUM PHOSPHATE 10 MG/ML IJ SOLN
INTRAMUSCULAR | Status: DC | PRN
  Administered 2017-11-25: 10 mg via INTRAVENOUS

## 2017-11-25 MED ORDER — FENTANYL CITRATE (PF) 100 MCG/2ML IJ SOLN: 25.0000 ug | INTRAMUSCULAR | Status: DC | PRN

## 2017-11-25 MED ORDER — SCOPOLAMINE 1 MG/3DAYS TD PT72
MEDICATED_PATCH | TRANSDERMAL | Status: AC
  Administered 2017-11-25: 1.5 mg via TRANSDERMAL
  Filled 2017-11-25: qty 1

## 2017-11-25 MED ORDER — FENTANYL CITRATE (PF) 100 MCG/2ML IJ SOLN
INTRAMUSCULAR | Status: DC | PRN
  Administered 2017-11-25: 50 ug via INTRAVENOUS
  Administered 2017-11-25: 25 ug via INTRAVENOUS

## 2017-11-25 MED ORDER — LIDOCAINE 2% (20 MG/ML) 5 ML SYRINGE
INTRAMUSCULAR | Status: DC | PRN
  Administered 2017-11-25: 40 mg via INTRAVENOUS

## 2017-11-25 MED ORDER — PROPOFOL 10 MG/ML IV BOLUS
INTRAVENOUS | Status: AC
  Filled 2017-11-25: qty 20

## 2017-11-25 MED ORDER — DEXAMETHASONE SODIUM PHOSPHATE 10 MG/ML IJ SOLN
INTRAMUSCULAR | Status: AC
Start: 2017-11-25 — End: 2017-11-25
  Filled 2017-11-25: qty 1

## 2017-11-25 MED ORDER — CEFAZOLIN SODIUM-DEXTROSE 2-4 GM/100ML-% IV SOLN
2.0000 g | INTRAVENOUS | Status: AC
  Administered 2017-11-25: 2 g via INTRAVENOUS
  Filled 2017-11-25: qty 100

## 2017-11-25 MED ORDER — MIDAZOLAM HCL 2 MG/2ML IJ SOLN
INTRAMUSCULAR | Status: AC
  Filled 2017-11-25: qty 2

## 2017-11-25 MED ORDER — CEFAZOLIN SODIUM-DEXTROSE 2-3 GM-%(50ML) IV SOLR
INTRAVENOUS | Status: AC
  Filled 2017-11-25: qty 50

## 2017-11-25 MED ORDER — LACTATED RINGERS IV SOLN: INTRAVENOUS | Status: DC

## 2017-11-25 MED ORDER — BUPIVACAINE HCL (PF) 0.25 % IJ SOLN
INTRAMUSCULAR | Status: AC
  Filled 2017-11-25: qty 30

## 2017-11-25 MED ORDER — TRAMADOL HCL 50 MG PO TABS: 50.0000 mg | ORAL_TABLET | Freq: Four times a day (QID) | ORAL | 0 refills | Status: DC | PRN

## 2017-11-25 MED ORDER — ONDANSETRON HCL 4 MG/2ML IJ SOLN
INTRAMUSCULAR | Status: DC | PRN
  Administered 2017-11-25: 4 mg via INTRAVENOUS

## 2017-11-25 MED ORDER — MIDAZOLAM HCL 5 MG/5ML IJ SOLN
INTRAMUSCULAR | Status: DC | PRN
  Administered 2017-11-25: 2 mg via INTRAVENOUS

## 2017-11-25 MED ORDER — LACTATED RINGERS IV SOLN
INTRAVENOUS | Status: DC
  Administered 2017-11-25: 08:00:00 via INTRAVENOUS

## 2017-11-25 MED ORDER — FENTANYL CITRATE (PF) 100 MCG/2ML IJ SOLN
INTRAMUSCULAR | Status: AC
  Filled 2017-11-25: qty 2

## 2017-11-25 SURGICAL SUPPLY — 15 items
ABLATOR ENDOMETRIAL BIPOLAR (ABLATOR) ×1 IMPLANT
CANISTER SUCT 3000ML PPV (MISCELLANEOUS) ×2 IMPLANT
CATH ROBINSON RED A/P 16FR (CATHETERS) ×2 IMPLANT
CONTAINER PREFILL 10% NBF 60ML (FORM) ×2 IMPLANT
GLOVE BIO SURGEON STRL SZ7.5 (GLOVE) ×2 IMPLANT
GLOVE BIOGEL PI IND STRL 7.0 (GLOVE) ×1 IMPLANT
GLOVE BIOGEL PI INDICATOR 7.0 (GLOVE) ×1
GOWN STRL REUS W/TWL LRG LVL3 (GOWN DISPOSABLE) ×4 IMPLANT
PACK VAGINAL MINOR WOMEN LF (CUSTOM PROCEDURE TRAY) ×2 IMPLANT
PAD OB MATERNITY 4.3X12.25 (PERSONAL CARE ITEMS) ×2 IMPLANT
PAD PREP 24X48 CUFFED NSTRL (MISCELLANEOUS) ×2 IMPLANT
SYR TB 1ML 25GX5/8 (SYRINGE) ×1 IMPLANT
TOWEL OR 17X24 6PK STRL BLUE (TOWEL DISPOSABLE) ×4 IMPLANT
TUBING AQUILEX INFLOW (TUBING) ×2 IMPLANT
TUBING AQUILEX OUTFLOW (TUBING) ×2 IMPLANT

## 2017-11-25 NOTE — Progress Notes (Signed)
Patient ID: Sydney Goodwin, female   DOB: [birth year], 41 y.o.   MRN: [id number] Patient seen and examined. Consent witnessed and signed. No changes noted. Update completed.

## 2017-11-25 NOTE — Anesthesia Preprocedure Evaluation (Signed)
Anesthesia Evaluation  Patient identified by MRN, date of birth, ID band Patient awake    Reviewed: Allergy & Precautions, H&P , NPO status , Patient's Chart, lab work & pertinent test results, reviewed documented beta blocker date and time   Airway Mallampati: II  TM Distance: >3 FB Neck ROM: full    Dental no notable dental hx.    Pulmonary neg pulmonary ROS,    Pulmonary exam normal breath sounds clear to auscultation       Cardiovascular Exercise Tolerance: Good negative cardio ROS   Rhythm:regular Rate:Normal     Neuro/Psych negative neurological ROS  negative psych ROS   GI/Hepatic negative GI ROS, Neg liver ROS,   Endo/Other  negative endocrine ROS  Renal/GU negative Renal ROS  negative genitourinary   Musculoskeletal   Abdominal   Peds  Hematology negative hematology ROS (+)   Anesthesia Other Findings   Reproductive/Obstetrics negative OB ROS                             Anesthesia Physical Anesthesia Plan  ASA: II  Anesthesia Plan: General and General LMA   Post-op Pain Management:    Induction:   PONV Risk Score and Plan: 3 and Ondansetron, Dexamethasone, Midazolam and Scopolamine patch - Pre-op  Airway Management Planned:   Additional Equipment:   Intra-op Plan:   Post-operative Plan:   Informed Consent: I have reviewed the patients History and Physical, chart, labs and discussed the procedure including the risks, benefits and alternatives for the proposed anesthesia with the patient or authorized representative who has indicated his/her understanding and acceptance.   Dental Advisory Given  Plan Discussed with: CRNA  Anesthesia Plan Comments:         Anesthesia Quick Evaluation

## 2017-11-25 NOTE — Op Note (Signed)
11/25/2017  9:20 AM  PATIENT:  Sydney Goodwin  41 y.o. female  PRE-OPERATIVE DIAGNOSIS:  Menorrhagia  POST-OPERATIVE DIAGNOSIS:  Menorrhagia Severe uterine retroversion  PROCEDURE:  Procedure(s): Diagnostic HYSTEROSCOPY  SURGEON:  Surgeon(s): Brien Few, MD  ASSISTANTS: none   ANESTHESIA:   local and general  ESTIMATED BLOOD LOSS: minimal Fluid deficit [id number] Complications: Midline uterine perforation   DRAINS: none   LOCAL MEDICATIONS USED:  MARCAINE    and Amount: 20 ml  SPECIMEN:  No Specimen  DISPOSITION OF SPECIMEN:  N/A  COUNTS:  YES  DICTATION #: [id number]  PLAN OF CARE: DC home  PATIENT DISPOSITION:  PACU - hemodynamically stable.

## 2017-11-25 NOTE — Anesthesia Procedure Notes (Signed)
Procedure Name: LMA Insertion Date/Time: 11/25/2017 8:58 AM Performed by: Verlin Grills, CRNA Pre-anesthesia Checklist: Patient identified, Emergency Drugs available, Suction available and Patient being monitored Patient Re-evaluated:Patient Re-evaluated prior to induction Oxygen Delivery Method: Circle system utilized Preoxygenation: Pre-oxygenation with 100% oxygen Induction Type: IV induction Ventilation: Mask ventilation without difficulty LMA: LMA inserted LMA Size: 3.0 Number of attempts: 1 Dental Injury: Teeth and Oropharynx as per pre-operative assessment

## 2017-11-25 NOTE — Op Note (Signed)
Sydney Goodwin, BRABANT NO.:  [id number]  MEDICAL RECORD NO.:  [id number]  LOCATION:                                 FACILITY:  PHYSICIAN:  Lovenia Kim, M.D.     DATE OF BIRTH:  DATE OF PROCEDURE: DATE OF DISCHARGE:                              OPERATIVE REPORT   PREOPERATIVE DIAGNOSIS:  Menometrorrhagia.  POSTOPERATIVE DIAGNOSES:  Menometrorrhagia, severe uterine retroversion, and cervical stenosis.  PROCEDURE:  Diagnostic hysteroscopy.  SURGEON:  Lovenia Kim, M.D.  ASSISTANT:  None.  ANESTHESIA:  General, local.  ESTIMATED BLOOD LOSS:  Less than 50 mL.  FLUID DEFICIT:  600 mL.  COMPLICATIONS:  Faulty operating room pump  The patient to recovery in good condition.  BRIEF OPERATIVE NOTE:  After being apprised of risks of anesthesia, infection, bleeding, possible injury to surrounding organs, need for repair, the patient was brought to the operating room.  Consent signed. She was placed in dorsal lithotomy position and administered general anesthetic without complications.  Prepped and draped in usual sterile fashion.  Catheterized until the bladder was empty.  Exam under anesthesia reveals and confirms severe uterine retroversion.  The cervix was stenotic, was probed using a small dilator.  Proposed pass was then entered using progressive dilatation up to a 23 Pratt dilator. Hysteroscope placed.  Immediate large deficit was noted and visualization created concerns regarding possible midline uterine perforation that was not actively bleeding.  At this time, no further intervention is required due to the midline perforation.  Procedure was terminated.  No D and C, no NovaSure were performed.  The patient is stable.  Dilute Marcaine solution was placed, 20 mL total for paracervical block.  Of note, the tenaculum on the posterior cervix has created small laceration, which was sutured using 1 figure-of-eight 2-0 Vicryl suture.   Good hemostasis achieved.  The patient tolerated the procedure well, was awakened and transferred to recovery in good condition. At the end of procedure, we noted a faulty pump situation that created the problems noted above. (This was confirmed during the next case)    Lovenia Kim, M.D.     RJT/MEDQ  D:  11/25/2017  T:  11/25/2017  Job:  [id number]  cc:   Lovenia Kim, M.D. Fax: [phone]

## 2017-11-25 NOTE — Transfer of Care (Signed)
Immediate Anesthesia Transfer of Care Note  Patient: Earlee Herald  Procedure(s) Performed: HYSTEROSCOPY (N/A Vagina )  Patient Location: PACU  Anesthesia Type:General  Level of Consciousness: awake, oriented and patient cooperative  Airway & Oxygen Therapy: Patient Spontanous Breathing and Patient connected to nasal cannula oxygen  Post-op Assessment: Report given to RN and Post -op Vital signs reviewed and stable  Post vital signs: Reviewed and stable  Last Vitals:  Vitals:   11/25/17 0752  BP: 111/70  Pulse: 92  Resp: 16  Temp: 36.7 C  SpO2: 99%    Last Pain:  Vitals:   11/25/17 0752  TempSrc: Oral      Patients Stated Pain Goal: 3 (21/19/41 7408)  Complications: No apparent anesthesia complications

## 2017-11-25 NOTE — Discharge Instructions (Signed)
General Anesthesia, Adult, Care After These instructions provide you with information about caring for yourself after your procedure. Your health care provider may also give you more specific instructions. Your treatment has been planned according to current medical practices, but problems sometimes occur. Call your health care provider if you have any problems or questions after your procedure. What can I expect after the procedure? After the procedure, it is common to have:  Vomiting.  A sore throat.  Mental slowness.  It is common to feel:  Nauseous.  Cold or shivery.  Sleepy.  Tired.  Sore or achy, even in parts of your body where you did not have surgery.  Follow these instructions at home: For at least 24 hours after the procedure:  Do not: ? Participate in activities where you could fall or become injured. ? Drive. ? Use heavy machinery. ? Drink alcohol. ? Take sleeping pills or medicines that cause drowsiness. ? Make important decisions or sign legal documents. ? Take care of children on your own.  Rest. Eating and drinking  If you vomit, drink water, juice, or soup when you can drink without vomiting.  Drink enough fluid to keep your urine clear or pale yellow.  Make sure you have little or no nausea before eating solid foods.  Follow the diet recommended by your health care provider. General instructions  Have a responsible adult stay with you until you are awake and alert.  Return to your normal activities as told by your health care provider. Ask your health care provider what activities are safe for you.  Take over-the-counter and prescription medicines only as told by your health care provider.  If you smoke, do not smoke without supervision.  Keep all follow-up visits as told by your health care provider. This is important. Contact a health care provider if:  You continue to have nausea or vomiting at home, and medicines are not helpful.  You  cannot drink fluids or start eating again.  You cannot urinate after 8-12 hours.  You develop a skin rash.  You have fever.  You have increasing redness at the site of your procedure. Get help right away if:  You have difficulty breathing.  You have chest pain.  You have unexpected bleeding.  You feel that you are having a life-threatening or urgent problem. This information is not intended to replace advice given to you by your health care provider. Make sure you discuss any questions you have with your health care provider. Document Released: 03/01/2001 Document Revised: 04/27/2016 Document Reviewed: 11/07/2015 Elsevier Interactive Patient Education  2018 [employer]. Hysteroscopy, Care After Refer to this sheet in the next few weeks. These instructions provide you with information on caring for yourself after your procedure. Your health care provider may also give you more specific instructions. Your treatment has been planned according to current medical practices, but problems sometimes occur. Call your health care provider if you have any problems or questions after your procedure. What can I expect after the procedure? After your procedure, it is typical to have the following:  You may have some cramping. This normally lasts for a couple days.  You may have bleeding. This can vary from light spotting for a few days to menstrual-like bleeding for 3-7 days.  Follow these instructions at home:  Rest for the first 1-2 days after the procedure.  Only take over-the-counter or prescription medicines as directed by your health care provider. Do not take aspirin. It can increase the  chances of bleeding.  Take showers instead of baths for 2 weeks or as directed by your health care provider.  Do not drive for 24 hours or as directed.  Do not drink alcohol while taking pain medicine.  Do not use tampons, douche, or have sexual intercourse for 2 weeks or until your health care  provider says it is okay.  Take your temperature twice a day for 4-5 days. Write it down each time.  Follow your health care provider's advice about diet, exercise, and lifting.  If you develop constipation, you may: ? Take a mild laxative if your health care provider approves. ? Add bran foods to your diet. ? Drink enough fluids to keep your urine clear or pale yellow.  Try to have someone with you or available to you for the first 24-48 hours, especially if you were given a general anesthetic.  Follow up with your health care provider as directed. Contact a health care provider if:  You feel dizzy or lightheaded.  You feel sick to your stomach (nauseous).  You have abnormal vaginal discharge.  You have a rash.  You have pain that is not controlled with medicine. Get help right away if:  You have bleeding that is heavier than a normal menstrual period.  You have a fever.  You have increasing cramps or pain, not controlled with medicine.  You have new belly (abdominal) pain.  You pass out.  You have pain in the tops of your shoulders (shoulder strap areas).  You have shortness of breath. This information is not intended to replace advice given to you by your health care provider. Make sure you discuss any questions you have with your health care provider. Document Released: 09/13/2013 Document Revised: 04/30/2016 Document Reviewed: 06/22/2013 Elsevier Interactive Patient Education  2017 [employer].

## 2017-11-26 ENCOUNTER — Encounter (HOSPITAL_COMMUNITY): Payer: Self-pay | Admitting: Obstetrics and Gynecology

## 2017-11-26 NOTE — Anesthesia Postprocedure Evaluation (Signed)
Anesthesia Post Note  Patient: Sydney Goodwin  Procedure(s) Performed: HYSTEROSCOPY (N/A Vagina )     Patient location during evaluation: PACU Anesthesia Type: General Level of consciousness: awake and alert Pain management: pain level controlled Vital Signs Assessment: post-procedure vital signs reviewed and stable Respiratory status: spontaneous breathing, nonlabored ventilation, respiratory function stable and patient connected to nasal cannula oxygen Cardiovascular status: blood pressure returned to baseline and stable Postop Assessment: no apparent nausea or vomiting Anesthetic complications: no    Last Vitals:  Vitals:   11/25/17 1030 11/25/17 1121  BP: 109/72 133/72  Pulse: 91 88  Resp: 19 16  Temp: 36.4 C 36.7 C  SpO2: 97% 99%    Last Pain:  Vitals:   11/25/17 1121  TempSrc:   PainSc: 2    Pain Goal: Patients Stated Pain Goal: 3 (11/25/17 0752)               Montez Hageman

## 2017-12-20 ENCOUNTER — Other Ambulatory Visit: Payer: Self-pay | Admitting: Obstetrics and Gynecology

## 2017-12-30 ENCOUNTER — Other Ambulatory Visit: Payer: Self-pay | Admitting: Obstetrics and Gynecology

## 2017-12-30 DIAGNOSIS — Z419 Encounter for procedure for purposes other than remedying health state, unspecified: Secondary | ICD-10-CM

## 2018-01-25 ENCOUNTER — Other Ambulatory Visit: Payer: Self-pay | Admitting: Obstetrics and Gynecology

## 2018-02-10 NOTE — Anesthesia Preprocedure Evaluation (Signed)
Anesthesia Evaluation  Patient identified by MRN, date of birth, ID band Patient awake    Reviewed: Allergy & Precautions, H&P , NPO status , Patient's Chart, lab work & pertinent test results, reviewed documented beta blocker date and time   Airway Mallampati: II  TM Distance: >3 FB Neck ROM: full    Dental no notable dental hx.    Pulmonary neg pulmonary ROS,    Pulmonary exam normal breath sounds clear to auscultation       Cardiovascular Exercise Tolerance: Good negative cardio ROS   Rhythm:regular Rate:Normal     Neuro/Psych negative neurological ROS  negative psych ROS   GI/Hepatic negative GI ROS, Neg liver ROS,   Endo/Other  negative endocrine ROS  Renal/GU negative Renal ROS  negative genitourinary   Musculoskeletal   Abdominal   Peds  Hematology negative hematology ROS (+)   Anesthesia Other Findings   Reproductive/Obstetrics negative OB ROS                             Anesthesia Physical  Anesthesia Plan  ASA: II  Anesthesia Plan: General and General LMA   Post-op Pain Management:    Induction:   PONV Risk Score and Plan: 3 and Ondansetron, Dexamethasone, Midazolam and Scopolamine patch - Pre-op  Airway Management Planned: LMA  Additional Equipment:   Intra-op Plan:   Post-operative Plan: Extubation in OR  Informed Consent: I have reviewed the patients History and Physical, chart, labs and discussed the procedure including the risks, benefits and alternatives for the proposed anesthesia with the patient or authorized representative who has indicated his/her understanding and acceptance.   Dental Advisory Given  Plan Discussed with: CRNA  Anesthesia Plan Comments:         Anesthesia Quick Evaluation

## 2018-02-11 ENCOUNTER — Encounter (HOSPITAL_COMMUNITY)
Admission: RE | Disposition: A | Discharge status: Home / Self Care | Payer: Self-pay | Source: Ambulatory Visit | Attending: Obstetrics and Gynecology

## 2018-02-11 ENCOUNTER — Encounter (HOSPITAL_COMMUNITY): Payer: Self-pay | Admitting: Emergency Medicine

## 2018-02-11 ENCOUNTER — Ambulatory Visit (HOSPITAL_COMMUNITY): Payer: BLUE CROSS/BLUE SHIELD | Admitting: Anesthesiology

## 2018-02-11 ENCOUNTER — Ambulatory Visit (HOSPITAL_COMMUNITY)
Admission: RE | Admit: 2018-02-11 | Discharge: 2018-02-11 | Disposition: A | Discharge status: Home / Self Care | Payer: BLUE CROSS/BLUE SHIELD | Source: Ambulatory Visit | Attending: Obstetrics and Gynecology | Admitting: Obstetrics and Gynecology

## 2018-02-11 ENCOUNTER — Other Ambulatory Visit: Payer: Self-pay

## 2018-02-11 ENCOUNTER — Ambulatory Visit (HOSPITAL_COMMUNITY): Payer: BLUE CROSS/BLUE SHIELD

## 2018-02-11 DIAGNOSIS — N920 Excessive and frequent menstruation with regular cycle: Secondary | ICD-10-CM | POA: Insufficient documentation

## 2018-02-11 DIAGNOSIS — N800 Endometriosis of uterus: Secondary | ICD-10-CM | POA: Insufficient documentation

## 2018-02-11 DIAGNOSIS — N939 Abnormal uterine and vaginal bleeding, unspecified: Secondary | ICD-10-CM

## 2018-02-11 DIAGNOSIS — N854 Malposition of uterus: Secondary | ICD-10-CM | POA: Diagnosis not present

## 2018-02-11 DIAGNOSIS — N882 Stricture and stenosis of cervix uteri: Secondary | ICD-10-CM | POA: Diagnosis not present

## 2018-02-11 HISTORY — PX: DILITATION & CURRETTAGE/HYSTROSCOPY WITH NOVASURE ABLATION: SHX5568

## 2018-02-11 LAB — BASIC METABOLIC PANEL
ANION GAP: 8 (ref 5–15)
BUN: 13 mg/dL (ref 6–20)
CALCIUM: 8.8 mg/dL — AB (ref 8.9–10.3)
CO2: 22 mmol/L (ref 22–32)
CREATININE: 0.66 mg/dL (ref 0.44–1.00)
Chloride: 108 mmol/L (ref 101–111)
GFR calc Af Amer: >60 mL/min (ref >60)
Glucose, Bld: 99 mg/dL (ref 65–99)
Potassium: 4.4 mmol/L (ref 3.5–5.1)
Sodium: 138 mmol/L (ref 135–145)

## 2018-02-11 LAB — HCG, SERUM, QUALITATIVE: PREG SERUM: NEGATIVE

## 2018-02-11 LAB — CBC
HCT: 37.3 % (ref 36.0–46.0)
HEMOGLOBIN: 12.7 g/dL (ref 12.0–15.0)
MCH: 30.5 pg (ref 26.0–34.0)
MCHC: 34 g/dL (ref 30.0–36.0)
MCV: 89.7 fL (ref 78.0–100.0)
Platelets: 294 10*3/uL (ref 150–400)
RBC: 4.16 MIL/uL (ref 3.87–5.11)
RDW: 13.1 % (ref 11.5–15.5)
WBC: 6 10*3/uL (ref 4.0–10.5)

## 2018-02-11 SURGERY — DILATATION & CURETTAGE/HYSTEROSCOPY WITH NOVASURE ABLATION
Anesthesia: General

## 2018-02-11 MED ORDER — VASOPRESSIN 20 UNIT/ML IV SOLN
INTRAVENOUS | Status: AC
  Filled 2018-02-11: qty 1

## 2018-02-11 MED ORDER — SCOPOLAMINE 1 MG/3DAYS TD PT72
1.0000 | MEDICATED_PATCH | Freq: Once | TRANSDERMAL | Status: DC
  Administered 2018-02-11: 1.5 mg via TRANSDERMAL

## 2018-02-11 MED ORDER — KETOROLAC TROMETHAMINE 30 MG/ML IJ SOLN
INTRAMUSCULAR | Status: AC
  Filled 2018-02-11: qty 1

## 2018-02-11 MED ORDER — GLYCOPYRROLATE 0.2 MG/ML IJ SOLN
INTRAMUSCULAR | Status: AC
  Filled 2018-02-11: qty 1

## 2018-02-11 MED ORDER — ONDANSETRON HCL 4 MG/2ML IJ SOLN
INTRAMUSCULAR | Status: DC | PRN
  Administered 2018-02-11: 4 mg via INTRAVENOUS

## 2018-02-11 MED ORDER — LIDOCAINE HCL (CARDIAC) 20 MG/ML IV SOLN
INTRAVENOUS | Status: DC | PRN
  Administered 2018-02-11: 80 mg via INTRAVENOUS

## 2018-02-11 MED ORDER — PHENYLEPHRINE 40 MCG/ML (10ML) SYRINGE FOR IV PUSH (FOR BLOOD PRESSURE SUPPORT)
PREFILLED_SYRINGE | INTRAVENOUS | Status: AC
  Filled 2018-02-11: qty 10

## 2018-02-11 MED ORDER — PROPOFOL 10 MG/ML IV BOLUS
INTRAVENOUS | Status: DC | PRN
  Administered 2018-02-11: 150 mg via INTRAVENOUS

## 2018-02-11 MED ORDER — ONDANSETRON HCL 4 MG/2ML IJ SOLN
INTRAMUSCULAR | Status: AC
  Filled 2018-02-11: qty 2

## 2018-02-11 MED ORDER — BUPIVACAINE HCL (PF) 0.25 % IJ SOLN
INTRAMUSCULAR | Status: AC
  Filled 2018-02-11: qty 30

## 2018-02-11 MED ORDER — LACTATED RINGERS IV SOLN
INTRAVENOUS | Status: DC
  Administered 2018-02-11 (×2): via INTRAVENOUS

## 2018-02-11 MED ORDER — GLYCOPYRROLATE 0.2 MG/ML IJ SOLN
INTRAMUSCULAR | Status: DC | PRN
  Administered 2018-02-11 (×2): 0.1 mg via INTRAVENOUS

## 2018-02-11 MED ORDER — MIDAZOLAM HCL 2 MG/2ML IJ SOLN
INTRAMUSCULAR | Status: DC | PRN
  Administered 2018-02-11: 2 mg via INTRAVENOUS

## 2018-02-11 MED ORDER — SCOPOLAMINE 1 MG/3DAYS TD PT72
MEDICATED_PATCH | TRANSDERMAL | Status: AC
  Administered 2018-02-11: 1.5 mg via TRANSDERMAL
  Filled 2018-02-11: qty 1

## 2018-02-11 MED ORDER — PROPOFOL 10 MG/ML IV BOLUS
INTRAVENOUS | Status: AC
  Filled 2018-02-11: qty 20

## 2018-02-11 MED ORDER — FENTANYL CITRATE (PF) 100 MCG/2ML IJ SOLN
INTRAMUSCULAR | Status: DC | PRN
  Administered 2018-02-11 (×2): 50 ug via INTRAVENOUS

## 2018-02-11 MED ORDER — DEXAMETHASONE SODIUM PHOSPHATE 4 MG/ML IJ SOLN
INTRAMUSCULAR | Status: DC | PRN
  Administered 2018-02-11: 8 mg via INTRAVENOUS

## 2018-02-11 MED ORDER — FENTANYL CITRATE (PF) 100 MCG/2ML IJ SOLN
INTRAMUSCULAR | Status: AC
  Filled 2018-02-11: qty 2

## 2018-02-11 MED ORDER — TRAMADOL HCL 50 MG PO TABS: 50.0000 mg | ORAL_TABLET | Freq: Four times a day (QID) | ORAL | 0 refills | Status: DC | PRN

## 2018-02-11 MED ORDER — FENTANYL CITRATE (PF) 250 MCG/5ML IJ SOLN
INTRAMUSCULAR | Status: AC
  Filled 2018-02-11: qty 5

## 2018-02-11 MED ORDER — CEFAZOLIN SODIUM-DEXTROSE 2-4 GM/100ML-% IV SOLN
2.0000 g | INTRAVENOUS | Status: AC
  Administered 2018-02-11: 2 g via INTRAVENOUS

## 2018-02-11 MED ORDER — DIPHENHYDRAMINE HCL 50 MG/ML IJ SOLN
INTRAMUSCULAR | Status: AC
  Filled 2018-02-11: qty 1

## 2018-02-11 MED ORDER — FENTANYL CITRATE (PF) 100 MCG/2ML IJ SOLN
25.0000 ug | INTRAMUSCULAR | Status: DC | PRN
  Administered 2018-02-11: 50 ug via INTRAVENOUS

## 2018-02-11 MED ORDER — DIPHENHYDRAMINE HCL 50 MG/ML IJ SOLN
12.5000 mg | Freq: Once | INTRAMUSCULAR | Status: AC
  Administered 2018-02-11: 12.5 mg via INTRAVENOUS

## 2018-02-11 MED ORDER — BUPIVACAINE HCL (PF) 0.25 % IJ SOLN
INTRAMUSCULAR | Status: DC | PRN
  Administered 2018-02-11: 20 mL

## 2018-02-11 MED ORDER — DEXAMETHASONE SODIUM PHOSPHATE 10 MG/ML IJ SOLN
INTRAMUSCULAR | Status: AC
  Filled 2018-02-11: qty 1

## 2018-02-11 MED ORDER — KETOROLAC TROMETHAMINE 30 MG/ML IJ SOLN
INTRAMUSCULAR | Status: DC | PRN
  Administered 2018-02-11: 30 mg via INTRAVENOUS

## 2018-02-11 MED ORDER — MIDAZOLAM HCL 2 MG/2ML IJ SOLN
INTRAMUSCULAR | Status: AC
  Filled 2018-02-11: qty 2

## 2018-02-11 MED ORDER — SODIUM CHLORIDE 0.9 % IJ SOLN
INTRAMUSCULAR | Status: AC
  Filled 2018-02-11: qty 50

## 2018-02-11 MED ORDER — LIDOCAINE HCL (CARDIAC) 20 MG/ML IV SOLN
INTRAVENOUS | Status: AC
  Filled 2018-02-11: qty 5

## 2018-02-11 SURGICAL SUPPLY — 14 items
ABLATOR ENDOMETRIAL BIPOLAR (ABLATOR) ×2 IMPLANT
CANISTER SUCT 3000ML PPV (MISCELLANEOUS) ×2 IMPLANT
CATH ROBINSON RED A/P 16FR (CATHETERS) ×2 IMPLANT
GLOVE BIO SURGEON STRL SZ7.5 (GLOVE) ×2 IMPLANT
GLOVE BIOGEL PI IND STRL 7.0 (GLOVE) ×1 IMPLANT
GLOVE BIOGEL PI INDICATOR 7.0 (GLOVE) ×1
GOWN STRL REUS W/TWL LRG LVL3 (GOWN DISPOSABLE) ×4 IMPLANT
PACK VAGINAL MINOR WOMEN LF (CUSTOM PROCEDURE TRAY) ×2 IMPLANT
PAD OB MATERNITY 4.3X12.25 (PERSONAL CARE ITEMS) ×2 IMPLANT
PAD PREP 24X48 CUFFED NSTRL (MISCELLANEOUS) ×2 IMPLANT
SYR TB 1ML 25GX5/8 (SYRINGE) ×2 IMPLANT
TOWEL OR 17X24 6PK STRL BLUE (TOWEL DISPOSABLE) ×4 IMPLANT
TUBING AQUILEX INFLOW (TUBING) ×2 IMPLANT
TUBING AQUILEX OUTFLOW (TUBING) ×2 IMPLANT

## 2018-02-11 NOTE — Anesthesia Postprocedure Evaluation (Signed)
Anesthesia Post Note  Patient: Sydney Goodwin  Procedure(s) Performed: ULTRASOUND GUIDED DILATATION & CURETTAGE/HYSTEROSCOPY WITH NOVASURE ABLATION (N/A )     Patient location during evaluation: PACU Anesthesia Type: General Level of consciousness: awake and alert Pain management: pain level controlled Vital Signs Assessment: post-procedure vital signs reviewed and stable Respiratory status: spontaneous breathing, nonlabored ventilation, respiratory function stable and patient connected to nasal cannula oxygen Cardiovascular status: blood pressure returned to baseline and stable Postop Assessment: no apparent nausea or vomiting Anesthetic complications: no    Last Vitals:  Vitals:   02/11/18 1100 02/11/18 1135  BP: 116/86 139/84  Pulse: 63 74  Resp: 18 20  Temp: 36.9 C   SpO2: 99% 100%    Last Pain:  Vitals:   02/11/18 1022  TempSrc:   PainSc: 7    Pain Goal: Patients Stated Pain Goal: 5 (02/11/18 0810)               Lyndle Herrlich EDWARD

## 2018-02-11 NOTE — Transfer of Care (Signed)
Immediate Anesthesia Transfer of Care Note  Patient: Callee Rohrig  Procedure(s) Performed: ULTRASOUND GUIDED DILATATION & CURETTAGE/HYSTEROSCOPY WITH NOVASURE ABLATION (N/A )  Patient Location: PACU  Anesthesia Type:General  Level of Consciousness: awake and patient cooperative  Airway & Oxygen Therapy: Patient Spontanous Breathing and Patient connected to nasal cannula oxygen  Post-op Assessment: Report given to RN and Post -op Vital signs reviewed and stable  Post vital signs: Reviewed and stable  Last Vitals:  Vitals:   02/11/18 0810  BP: 129/90  Pulse: 92  Resp: 16  Temp: 36.7 C  SpO2: 100%    Last Pain:  Vitals:   02/11/18 0810  TempSrc: Oral      Patients Stated Pain Goal: 5 (68/15/94 7076)  Complications: No apparent anesthesia complications

## 2018-02-11 NOTE — Anesthesia Procedure Notes (Signed)
Procedure Name: LMA Insertion Date/Time: 02/11/2018 9:30 AM Performed by: Georgeanne Nim, CRNA Pre-anesthesia Checklist: Patient identified, Emergency Drugs available, Suction available, Patient being monitored and Timeout performed Patient Re-evaluated:Patient Re-evaluated prior to induction Oxygen Delivery Method: Circle system utilized Preoxygenation: Pre-oxygenation with 100% oxygen Induction Type: IV induction Ventilation: Mask ventilation without difficulty LMA: LMA inserted LMA Size: 4.0 Number of attempts: 1 Placement Confirmation: positive ETCO2,  CO2 detector and breath sounds checked- equal and bilateral Tube secured with: Tape Dental Injury: Teeth and Oropharynx as per pre-operative assessment

## 2018-02-11 NOTE — Op Note (Signed)
[name], [name]               ACCOUNT NO.:  [id number]  MEDICAL RECORD NO.:  [id number]  LOCATION:                                 FACILITY:  PHYSICIAN:  Lovenia Kim, M.D.     DATE OF BIRTH:  DATE OF PROCEDURE: DATE OF DISCHARGE:                              OPERATIVE REPORT   PREOPERATIVE DIAGNOSIS:  Menorrhagia, severe cervical stenosis, uterine retroversion, history of mechanical failure with previous ablation attempt.  POSTOPERATIVE DIAGNOSIS:  Menorrhagia, severe cervical stenosis, uterine retroversion, history of mechanical failure with previous ablation attempt.  PROCEDURE:  Diagnostic hysteroscopy, D and C, NovaSure endometrial ablation with ultrasound guidance.  SURGEON:  Lovenia Kim, M.D.  ASSISTANT:  None.  ANESTHESIA:  Local and general.  ESTIMATED BLOOD LOSS:  Less than 50 mL.  FLUID DEFICIT:  125 mL.  COMPLICATIONS:  None.  DRAINS:  None.  COUNTS:  Correct.  DISPOSITION:  The patient to recovery in good condition.  BRIEF OPERATIVE NOTE:  After being apprised of risks of anesthesia, infection, bleeding and injury to surrounding organs, possible need for repair, delayed versus immediate complications to include bowel and bladder injury, possible need for repair, the patient was brought to the operating room.  She was administered general anesthetic without complications.  She was prepped and draped in usual sterile fashion, catheterized until the bladder was empty.  Exam under anesthesia reveals a severely retroflexed uterus and cervical stenosis.  Ultrasound is placed transabdominally after dilute Pitressin was placed at 3 and 9 o'clock, 20 mL total and dilute paracervical block with 20 mL of dilute Marcaine.  Using ultrasound guidance, the cavity was identified with slow dilatation up to a 27 Pratt dilator due to the severe angle of retroversion, extensive dilatation was required in order to pass the hysteroscope, which was done  successfully under ultrasound guidance in the endometrial cavity, which appears normal.  Some thickening of endometrial along the right lateral portion noted.  D and C were performed using sharp curettage in a 4-quadrant method.  NovaSure device was then placed under ultrasound guidance with a length of 6, a width of 3.7, and CO2 test was then initiated and found to be negative.  The procedure is initiated for approximately 1 minute at the end of the procedure, which was done under ultrasound guidance.  The device was inspected and found to be intact.  Revisualization endometrial cavity reveals a well ablated cavity without evidence of uterine perforation.  Good hemostasis was noted.  The patient tolerated the procedure well, was awakened and transferred to recovery in good condition.     Lovenia Kim, M.D.     RJT/MEDQ  D:  02/11/2018  T:  02/11/2018  Job:  [id number]

## 2018-02-11 NOTE — H&P (Addendum)
Signed           Sydney Goodwin, Sydney Goodwin NO.:  [id number]  MEDICAL RECORD NO.:  [id number]  LOCATION:                                 FACILITY:  PHYSICIAN:  Lovenia Kim, M.D.     DATE OF BIRTH:  DATE OF ADMISSION: DATE OF DISCHARGE:                             HISTORY & PHYSICAL   CHIEF COMPLAINT:  Refractory menometrorrhagia.  HISTORY OF PRESENT ILLNESS:  A 42 year old white female, G4, P4, history of C-section x4, with refractory menorrhagia for definitive therapy. History of failed procedure due to machine dysfunction.  MEDICATIONS:  Effexor.  SOCIAL HISTORY:  Nonsmoker, nondrinker.  Denies domestic or physical violence.  GYNECOLOGIC HISTORY:  History of C-section x4.  FAMILY HISTORY:  Kidney and lung disease, colon cancer, diabetes, and varicosities.  PHYSICAL EXAMINATION:  GENERAL:  Well-developed, well-nourished white female, in no acute distress. HEENT:  Normal. NECK:  Supple.  Full range of motion. LUNGS:  Clear. HEART:  Regular rate and rhythm. ABDOMEN:  Soft and nontender. PELVIC:  Reveals uterus to be bulky, retroflexed.  No adnexal masses. EXTREMITIES:  There are no cords. NEUROLOGIC:  Nonfocal. SKIN:  Intact.  IMPRESSION:  Refractory menometrorrhagia, for definitive therapy. History of mechanical failure with hysteroscopy in 12/18  PLAN:  Proceed with diagnostic hysteroscopy, D and C, NovaSure endometrial ablation.  Risks of anesthesia, infection, bleeding, injury to surrounding organs, possible need for repair were discussed.  Delayed versus immediate complications to include bowel and bladder injury noted.  Failure risks of NovaSure were discussed.  The patient acknowledges and wishes to proceed. Due to severe uterine retroversion and cervical stenosis, will do under sono guidance.     Lovenia Kim, M.D.               Last signed by: Brien Few,  MD at 11/25/2017 8:37 AM  Electronically signed by Brien Few, MD at 11/25/2017 8:37 AM     Admission (Discharged) on 11/25/2017        Routing History        Detailed Report     Brien Few, MD  Physician  Obstetrics  H&P  Signed  Date of Service:  11/24/2017 9:14 PM          Signed           NAME:  Sydney Goodwin, Sydney Goodwin NO.:  [id number]  MEDICAL RECORD NO.:  [id number]  LOCATION:                                 FACILITY:  PHYSICIAN:  Lovenia Kim, M.D.     DATE OF BIRTH:  DATE OF ADMISSION: DATE OF DISCHARGE:                             HISTORY & PHYSICAL   CHIEF COMPLAINT:  Refractory  menometrorrhagia.  HISTORY OF PRESENT ILLNESS:  A 42 year old white female, G4, P4, history of C-section x4, with refractory menorrhagia for definitive therapy.  MEDICATIONS:  Effexor.  SOCIAL HISTORY:  Nonsmoker, nondrinker.  Denies domestic or physical violence.  GYNECOLOGIC HISTORY:  History of C-section x4.  FAMILY HISTORY:  Kidney and lung disease, colon cancer, diabetes, and varicosities.  PHYSICAL EXAMINATION:  GENERAL:  Well-developed, well-nourished white female, in no acute distress. HEENT:  Normal. NECK:  Supple.  Full range of motion. LUNGS:  Clear. HEART:  Regular rate and rhythm. ABDOMEN:  Soft and nontender. PELVIC:  Reveals uterus to be bulky, retroflexed.  No adnexal masses. EXTREMITIES:  There are no cords. NEUROLOGIC:  Nonfocal. SKIN:  Intact.  IMPRESSION:  Refractory menometrorrhagia, for definitive therapy.  PLAN:  Proceed with diagnostic hysteroscopy, D and C, NovaSure endometrial ablation.  Risks of anesthesia, infection, bleeding, injury to surrounding organs, possible need for repair were discussed.  Delayed versus immediate complications to include bowel and bladder injury noted.  Failure risks of NovaSure were discussed.  The patient acknowledges and wishes to  proceed.     Lovenia Kim, M.D.     RJT/MEDQ  D:  11/24/2017  T:  11/24/2017  Job:  [id number]  cc:   [facility]           Last signed by: Brien Few, MD at 11/25/2017 8:37 AM  Electronically signed by Brien Few, MD at 11/25/2017 8:37 AM     Admission (Discharged) on 11/25/2017        Routing History        Detailed Report    HISTORY & PHYSICAL   CHIEF COMPLAINT:  Refractory menometrorrhagia.  HISTORY OF PRESENT ILLNESS:  A 42 year old white female, G4, P4, history of C-section x4, with refractory menorrhagia for definitive therapy.  MEDICATIONS:  Effexor.  SOCIAL HISTORY:  Nonsmoker, nondrinker.  Denies domestic or physical violence.  GYNECOLOGIC HISTORY:  History of C-section x4.  FAMILY HISTORY:  Kidney and lung disease, colon cancer, diabetes, and varicosities.  PHYSICAL EXAMINATION:  GENERAL:  Well-developed, well-nourished white female, in no acute distress. HEENT:  Normal. NECK:  Supple.  Full range of motion. LUNGS:  Clear. HEART:  Regular rate and rhythm. ABDOMEN:  Soft and nontender. PELVIC:  Reveals uterus to be bulky, retroflexed.  No adnexal masses. EXTREMITIES:  There are no cords. NEUROLOGIC:  Nonfocal. SKIN:  Intact.  IMPRESSION:  Refractory menometrorrhagia, for definitive therapy.  PLAN:  Proceed with diagnostic hysteroscopy, D and C, NovaSure endometrial ablation.  Risks of anesthesia, infection, bleeding, injury to surrounding organs, possible need for repair were discussed.  Delayed versus immediate complications to include bowel and bladder injury noted.  Failure risks of NovaSure were discussed.  The patient acknowledges and wishes to proceed.

## 2018-02-11 NOTE — Progress Notes (Signed)
Patient ID: Sydney Goodwin, female   DOB: Sep 19, 1976, 42 y.o.   MRN: [id number] Patient seen and examined. Consent witnessed and signed. No changes noted. Update completed.

## 2018-02-11 NOTE — Discharge Instructions (Signed)
DISCHARGE INSTRUCTIONS: HYSTEROSCOPY / ENDOMETRIAL ABLATION °The following instructions have been prepared to help you care for yourself upon your return home. ° °May Remove Scop patch on or before ° °May take Ibuprofen after ° °May take stool softner while taking narcotic pain medication to prevent constipation.  Drink plenty of water. ° °Personal hygiene: °• Use sanitary pads for vaginal drainage, not tampons. °• Shower the day after your procedure. °• NO tub baths, pools or Jacuzzis for 2-3 weeks. °• Wipe front to back after using the bathroom. ° °Activity and limitations: °• Do NOT drive or operate any equipment for 24 hours. The effects of anesthesia are still present °and drowsiness may result. °• Do NOT rest in bed all day. °• Walking is encouraged. °• Walk up and down stairs slowly. °• You may resume your normal activity in one to two days or as indicated by your physician. °Sexual activity: NO intercourse for at least 2 weeks after the procedure, or as indicated by your °Doctor. ° °Diet: Eat a light meal as desired this evening. You may resume your usual diet tomorrow. ° °Return to Work: You may resume your work activities in one to two days or as indicated by your °Doctor. ° °What to expect after your surgery: Expect to have vaginal bleeding/discharge for 2-3 days and °spotting for up to 10 days. It is not unusual to have soreness for up to 1-2 weeks. You may have a °slight burning sensation when you urinate for the first day. Mild cramps may continue for a couple of °days. You may have a regular period in 2-6 weeks. ° °Call your doctor for any of the following: °• Excessive vaginal bleeding or clotting, saturating and changing one pad every hour. °• Inability to urinate 6 hours after discharge from hospital. °• Pain not relieved by pain medication. °• Fever of 100.4° F or greater. °• Unusual vaginal discharge or odor. ° °Return to office _________________Call for an appointment  ___________________ °Patient’s signature: ______________________ °Nurse’s signature ________________________ ° °Post Anesthesia Care Unit [phone] °Post Anesthesia Home Care Instructions ° °Activity: °Get plenty of rest for the remainder of the day. A responsible individual must stay with you for 24 hours following the procedure.  °For the next 24 hours, DO NOT: °-Drive a car °-Operate machinery °-Drink alcoholic beverages °-Take any medication unless instructed by your physician °-Make any legal decisions or sign important papers. ° °Meals: °Start with liquid foods such as gelatin or soup. Progress to regular foods as tolerated. Avoid greasy, spicy, heavy foods. If nausea and/or vomiting occur, drink only clear liquids until the nausea and/or vomiting subsides. Call your physician if vomiting continues. ° °Special Instructions/Symptoms: °Your throat may feel dry or sore from the anesthesia or the breathing tube placed in your throat during surgery. If this causes discomfort, gargle with warm salt water. The discomfort should disappear within 24 hours. ° °If you had a scopolamine patch placed behind your ear for the management of post- operative nausea and/or vomiting: ° °1. The medication in the patch is effective for 72 hours, after which it should be removed.  Wrap patch in a tissue and discard in the trash. Wash hands thoroughly with soap and water. °2. You may remove the patch earlier than 72 hours if you experience unpleasant side effects which may include dry mouth, dizziness or visual disturbances. °3. Avoid touching the patch. Wash your hands with soap and water after contact with the patch. °  ° °  NO IBUPROFEN PRODUCTS (MOTRIN, ADVIL) OR ALEVE UNTIL 4:00PM TODAY.

## 2018-02-11 NOTE — Op Note (Signed)
02/11/2018  10:03 AM  PATIENT:  [name]  42 y.o. female  PRE-OPERATIVE DIAGNOSIS:  Menorrhagia Severe cervical stenosis Uterine retroversion  POST-OPERATIVE DIAGNOSIS:  Menorrhagia and same  PROCEDURE:  Procedure(s): ULTRASOUND GUIDED DILATATION & CURETTAGE/ DIAGNOSTIC HYSTEROSCOPY NOVASURE ENDOMETRIAL ABLATION  SURGEON:  Surgeon(s): Brien Few, MD  ASSISTANTS: none   ANESTHESIA:   local and general  ESTIMATED BLOOD LOSS: 25 mL   DRAINS: none   LOCAL MEDICATIONS USED:  MARCAINE    and Amount: 20 ml  SPECIMEN:  Source of Specimen:  [facility]  DISPOSITION OF SPECIMEN:  PATHOLOGY  COUNTS:  YES  DICTATION #: DONE  PLAN OF CARE: DC HOME  PATIENT DISPOSITION:  PACU - hemodynamically stable.

## 2018-02-13 ENCOUNTER — Encounter (HOSPITAL_COMMUNITY): Payer: Self-pay | Admitting: Obstetrics and Gynecology

## 2019-01-31 DIAGNOSIS — Z6829 Body mass index (BMI) 29.0-29.9, adult: Secondary | ICD-10-CM | POA: Diagnosis not present

## 2019-01-31 DIAGNOSIS — Z1231 Encounter for screening mammogram for malignant neoplasm of breast: Secondary | ICD-10-CM | POA: Diagnosis not present

## 2019-01-31 DIAGNOSIS — Z01419 Encounter for gynecological examination (general) (routine) without abnormal findings: Secondary | ICD-10-CM | POA: Diagnosis not present

## 2019-01-31 DIAGNOSIS — Z1151 Encounter for screening for human papillomavirus (HPV): Secondary | ICD-10-CM | POA: Diagnosis not present

## 2019-02-21 ENCOUNTER — Encounter: Payer: Self-pay | Admitting: Internal Medicine

## 2019-06-24 DIAGNOSIS — Z20828 Contact with and (suspected) exposure to other viral communicable diseases: Secondary | ICD-10-CM | POA: Diagnosis not present

## 2019-08-19 DIAGNOSIS — Z23 Encounter for immunization: Secondary | ICD-10-CM | POA: Diagnosis not present

## 2020-03-18 ENCOUNTER — Ambulatory Visit: Payer: BC Managed Care – PPO | Attending: Internal Medicine

## 2020-03-18 DIAGNOSIS — Z20822 Contact with and (suspected) exposure to covid-19: Secondary | ICD-10-CM

## 2020-03-20 LAB — NOVEL CORONAVIRUS, NAA: SARS-CoV-2, NAA: NOT DETECTED

## 2020-03-20 LAB — SARS-COV-2, NAA 2 DAY TAT

## 2020-04-29 DIAGNOSIS — F419 Anxiety disorder, unspecified: Secondary | ICD-10-CM | POA: Diagnosis not present

## 2020-04-29 DIAGNOSIS — Z6831 Body mass index (BMI) 31.0-31.9, adult: Secondary | ICD-10-CM | POA: Diagnosis not present

## 2020-04-29 DIAGNOSIS — Z01419 Encounter for gynecological examination (general) (routine) without abnormal findings: Secondary | ICD-10-CM | POA: Diagnosis not present

## 2020-04-29 DIAGNOSIS — Z1231 Encounter for screening mammogram for malignant neoplasm of breast: Secondary | ICD-10-CM | POA: Diagnosis not present

## 2020-04-29 DIAGNOSIS — L292 Pruritus vulvae: Secondary | ICD-10-CM | POA: Diagnosis not present

## 2020-09-14 DIAGNOSIS — Z23 Encounter for immunization: Secondary | ICD-10-CM | POA: Diagnosis not present

## 2020-11-19 DIAGNOSIS — Z1152 Encounter for screening for COVID-19: Secondary | ICD-10-CM | POA: Diagnosis not present

## 2021-01-02 ENCOUNTER — Encounter: Payer: Self-pay | Admitting: Gastroenterology

## 2021-01-30 ENCOUNTER — Ambulatory Visit (AMBULATORY_SURGERY_CENTER): Payer: Self-pay

## 2021-01-30 ENCOUNTER — Other Ambulatory Visit: Payer: Self-pay

## 2021-01-30 VITALS — Ht 65.0 in | Wt 188.0 lb

## 2021-01-30 DIAGNOSIS — Z1211 Encounter for screening for malignant neoplasm of colon: Secondary | ICD-10-CM

## 2021-01-30 NOTE — Progress Notes (Signed)
No allergies to soy or egg Pt is not on blood thinners or diet pills Denies issues with sedation/intubation Denies atrial flutter/fib Denies constipation   Emmi instructions given to pt  Pt is aware of Covid safety and care partner requirements.  

## 2021-02-06 ENCOUNTER — Encounter: Payer: Self-pay | Admitting: Gastroenterology

## 2021-02-14 ENCOUNTER — Other Ambulatory Visit: Payer: Self-pay

## 2021-02-14 ENCOUNTER — Ambulatory Visit (AMBULATORY_SURGERY_CENTER): Payer: BC Managed Care – PPO | Admitting: Gastroenterology

## 2021-02-14 ENCOUNTER — Encounter: Payer: Self-pay | Admitting: Gastroenterology

## 2021-02-14 VITALS — BP 129/91 | HR 78 | Temp 98.0°F | Resp 13 | Ht 65.0 in | Wt 188.0 lb

## 2021-02-14 DIAGNOSIS — Z1211 Encounter for screening for malignant neoplasm of colon: Secondary | ICD-10-CM

## 2021-02-14 DIAGNOSIS — D128 Benign neoplasm of rectum: Secondary | ICD-10-CM

## 2021-02-14 DIAGNOSIS — C187 Malignant neoplasm of sigmoid colon: Secondary | ICD-10-CM | POA: Diagnosis not present

## 2021-02-14 DIAGNOSIS — D125 Benign neoplasm of sigmoid colon: Secondary | ICD-10-CM

## 2021-02-14 MED ORDER — SODIUM CHLORIDE 0.9 % IV SOLN: 500.0000 mL | INTRAVENOUS | Status: DC

## 2021-02-14 NOTE — Progress Notes (Signed)
A and O x3. Report to RN. Tolerated MAC anesthesia well.

## 2021-02-14 NOTE — Progress Notes (Signed)
Called to room to assist during endoscopic procedure.  Patient ID and intended procedure confirmed with present staff. Received instructions for my participation in the procedure from the performing physician.  

## 2021-02-14 NOTE — Patient Instructions (Signed)
Please read handouts provided. Continue present medications. Await pathology results.   YOU HAD AN ENDOSCOPIC PROCEDURE TODAY AT THE [facility]:   Refer to the procedure report that was given to you for any specific questions about what was found during the examination.  If the procedure report does not answer your questions, please call your gastroenterologist to clarify.  If you requested that your care partner not be given the details of your procedure findings, then the procedure report has been included in a sealed envelope for you to review at your convenience later.  YOU SHOULD EXPECT: Some feelings of bloating in the abdomen. Passage of more gas than usual.  Walking can help get rid of the air that was put into your GI tract during the procedure and reduce the bloating. If you had a lower endoscopy (such as a colonoscopy or flexible sigmoidoscopy) you may notice spotting of blood in your stool or on the toilet paper. If you underwent a bowel prep for your procedure, you may not have a normal bowel movement for a few days.  Please Note:  You might notice some irritation and congestion in your nose or some drainage.  This is from the oxygen used during your procedure.  There is no need for concern and it should clear up in a day or so.  SYMPTOMS TO REPORT IMMEDIATELY:  Following lower endoscopy (colonoscopy or flexible sigmoidoscopy):  Excessive amounts of blood in the stool  Significant tenderness or worsening of abdominal pains  Swelling of the abdomen that is new, acute  Fever of 100F or higher   For urgent or emergent issues, a gastroenterologist can be reached at any hour by calling [phone]. Do not use MyChart messaging for urgent concerns.    DIET:  We do recommend a small meal at first, but then you may proceed to your regular diet.  Drink plenty of fluids but you should avoid alcoholic beverages for 24 hours.  ACTIVITY:  You should plan to take it easy  for the rest of today and you should NOT DRIVE or use heavy machinery until tomorrow (because of the sedation medicines used during the test).    FOLLOW UP: Our staff will call the number listed on your records 48-72 hours following your procedure to check on you and address any questions or concerns that you may have regarding the information given to you following your procedure. If we do not reach you, we will leave a message.  We will attempt to reach you two times.  During this call, we will ask if you have developed any symptoms of COVID 19. If you develop any symptoms (ie: fever, flu-like symptoms, shortness of breath, cough etc.) before then, please call [phone].  If you test positive for Covid 19 in the 2 weeks post procedure, please call and report this information to us.    If any biopsies were taken you will be contacted by phone or by letter within the next 1-3 weeks.  Please call us at [phone] if you have not heard about the biopsies in 3 weeks.    SIGNATURES/CONFIDENTIALITY: You and/or your care partner have signed paperwork which will be entered into your electronic medical record.  These signatures attest to the fact that that the information above on your After Visit Summary has been reviewed and is understood.  Full responsibility of the confidentiality of this discharge information lies with you and/or your care-partner.  

## 2021-02-14 NOTE — Op Note (Signed)
[place] Patient Name: Sydney Goodwin Procedure Date: 02/14/2021 8:17 AM MRN: [id number] Endoscopist: Milus Banister , MD Age: 45 Referring MD:  Date of Birth: [birth year] Gender: Female Account #: [id number] Procedure:                Colonoscopy Indications:              Screening for colorectal malignant neoplasm Medicines:                Monitored Anesthesia Care Procedure:                Pre-Anesthesia Assessment:                           - Prior to the procedure, a History and Physical                            was performed, and patient medications and                            allergies were reviewed. The patient's tolerance of                            previous anesthesia was also reviewed. The risks                            and benefits of the procedure and the sedation                            options and risks were discussed with the patient.                            All questions were answered, and informed consent                            was obtained. Prior Anticoagulants: The patient has                            taken no previous anticoagulant or antiplatelet                            agents. ASA Grade Assessment: II - A patient with                            mild systemic disease. After reviewing the risks                            and benefits, the patient was deemed in                            satisfactory condition to undergo the procedure.                           After obtaining informed consent, the colonoscope  was passed under direct vision. Throughout the                            procedure, the patient's blood pressure, pulse, and                            oxygen saturations were monitored continuously. The                            Olympus CF-HQ190 [id number]) Colonoscope was                            introduced through the anus and advanced to the the                            cecum, identified by  appendiceal orifice and                            ileocecal valve. The colonoscopy was performed                            without difficulty. The patient tolerated the                            procedure well. The quality of the bowel                            preparation was good. The ileocecal valve,                            appendiceal orifice, and rectum were photographed. Scope In: 8:29:23 AM Scope Out: 8:52:59 AM Scope Withdrawal Time: 0 hours 16 minutes 26 seconds  Total Procedure Duration: 0 hours 23 minutes 36 seconds  Findings:                 A 15 mm polyp was found in the sigmoid colon. The                            polyp was semi-pedunculated. The polyp was removed                            with a hot snare. Resection and retrieval were                            complete. jar 1. Following resection the site was                            labeled with two submucosal injections or Spot.                           A 4 mm polyp was found in the distal rectum. The  polyp was sessile. The polyp was removed with a                            cold snare. Resection and retrieval were complete.                            jar 2.                           The examination was otherwise normal. Complications:            No immediate complications. Estimated blood loss:                            None. Estimated Blood Loss:     Estimated blood loss: none. Impression:               - One 15 mm polyp in the sigmoid colon, removed                            with a hot snare. Resected and retrieved. Site                            labled with Spot submucosal injections.                           - One 4 mm polyp in the distal rectum, removed with                            a cold snare. Resected and retrieved.                           - Otherwise normal examination. Recommendation:           - Patient has a contact number available for                             emergencies. The signs and symptoms of potential                            delayed complications were discussed with the                            patient. Return to normal activities tomorrow.                            Written discharge instructions were provided to the                            patient.                           - Resume previous diet.                           - Continue present medications.                           -  Await pathology results. Milus Banister, MD 02/14/2021 8:57:12 AM This report has been signed electronically.

## 2021-02-19 ENCOUNTER — Telehealth: Payer: Self-pay | Admitting: *Deleted

## 2021-02-19 ENCOUNTER — Telehealth: Payer: Self-pay

## 2021-02-19 NOTE — Telephone Encounter (Signed)
No answer for second post procedure callback and unable to leave message.

## 2021-02-19 NOTE — Telephone Encounter (Signed)
Unable to leave message mailbox is full.

## 2021-02-21 ENCOUNTER — Other Ambulatory Visit: Payer: Self-pay

## 2021-02-21 DIAGNOSIS — C189 Malignant neoplasm of colon, unspecified: Secondary | ICD-10-CM

## 2021-02-25 ENCOUNTER — Ambulatory Visit (HOSPITAL_COMMUNITY)
Admission: RE | Admit: 2021-02-25 | Discharge: 2021-02-25 | Disposition: A | Discharge status: Home / Self Care | Payer: BC Managed Care – PPO | Source: Ambulatory Visit | Attending: Gastroenterology | Admitting: Gastroenterology

## 2021-02-25 ENCOUNTER — Other Ambulatory Visit: Payer: Self-pay

## 2021-02-25 DIAGNOSIS — C189 Malignant neoplasm of colon, unspecified: Secondary | ICD-10-CM | POA: Insufficient documentation

## 2021-02-25 DIAGNOSIS — K7689 Other specified diseases of liver: Secondary | ICD-10-CM | POA: Diagnosis not present

## 2021-02-25 DIAGNOSIS — K449 Diaphragmatic hernia without obstruction or gangrene: Secondary | ICD-10-CM | POA: Diagnosis not present

## 2021-02-25 DIAGNOSIS — R911 Solitary pulmonary nodule: Secondary | ICD-10-CM | POA: Diagnosis not present

## 2021-02-25 MED ORDER — IOHEXOL 300 MG/ML  SOLN
100.0000 mL | Freq: Once | INTRAMUSCULAR | Status: AC | PRN
  Administered 2021-02-25: 100 mL via INTRAVENOUS

## 2021-02-26 ENCOUNTER — Other Ambulatory Visit: Payer: Self-pay

## 2021-02-26 ENCOUNTER — Telehealth: Payer: Self-pay | Admitting: Gastroenterology

## 2021-02-26 DIAGNOSIS — K769 Liver disease, unspecified: Secondary | ICD-10-CM

## 2021-02-26 NOTE — Telephone Encounter (Signed)
The pt husband has been advised that he will get a call from the schedulers for MRI appt.  The pt husband has also been advised that genetics will call with appt.  Phone numbers provided.

## 2021-02-26 NOTE — Progress Notes (Signed)
The proposed treatment discussed in conference is for discussion purposes only and is not a binding recommendation.  The patients have not been physically examined, or presented with their treatment options.  Therefore, final treatment plans cannot be decided.   

## 2021-02-26 NOTE — Telephone Encounter (Signed)
I spoke with her husband about the GI cancer conference this morning.  Preliminary review of her liver showed a very small hypodense lesion but was otherwise essentially normal.  We will wait for formal radiologic reading however I would like to proceed with MRI evaluation of the liver.  Chong Sicilian, can you go through her husband to schedule the liver MRI, his name is Donna Christen, his cell phone number is [phone]  She also needs referral to genetic counselor for colon cancer at a young age.  Let them know also that her mother had cancer in a polyp as well.

## 2021-03-10 ENCOUNTER — Ambulatory Visit: Payer: Self-pay | Admitting: General Surgery

## 2021-03-10 DIAGNOSIS — C187 Malignant neoplasm of sigmoid colon: Secondary | ICD-10-CM | POA: Diagnosis not present

## 2021-03-10 MED ORDER — DEXTROSE 5 % IV SOLN: 900.0000 mg | INTRAVENOUS | Status: DC

## 2021-03-10 MED ORDER — GENTAMICIN SULFATE 40 MG/ML IJ SOLN: 5.0000 mg/kg | INTRAVENOUS | Status: DC

## 2021-03-10 NOTE — H&P (Signed)
The patient is a 45 year old female who presents with colorectal cancer. Patient underwent a screening colonoscopy with Dr. Ardis Hughs. One polyp in the rectum was found as well as one polyp in the sigmoid. This was tattooed. Biopsy showed a small focus of invasive carcinoma approximately 2 mm from the margin. Patient does have a family history of colon cancer in her mother. This was also found in a polyp which was resected completely. Surgical history consist of 4 C-sections.   Past Surgical History Sydney Goodwin, [place]; 2021-04-07 9:22 AM) Cesarean Section - 1 Colon Polyp Removal - Colonoscopy Tonsillectomy  Diagnostic Studies History Sydney Goodwin, [place]; 04/07/21 9:22 AM) Colonoscopy within last year Mammogram within last year Pap Smear 1-5 years ago  Allergies Sydney Goodwin, CMA; 2021/04/07 9:27 AM) Penicillin G Sodium *PENICILLINS* Morphine Sulfate (Bulk) *ANALGESICS - OPIOID*  Social History Sydney Goodwin, CMA; Apr 07, 2021 9:22 AM) Alcohol use Occasional alcohol use. Caffeine use Carbonated beverages, Coffee, Tea. No drug use Tobacco use Never smoker.  Family History Sydney Goodwin, [place]; 2021-04-07 9:22 AM) Colon Cancer Mother.  Pregnancy / Birth History Sydney Goodwin, [place]; Apr 07, 2021 9:22 AM) Age at menarche 74 years. Gravida 4 Irregular periods Length (months) of breastfeeding 7-12 Maternal age 70-30 Para 4  Other Problems Sydney Goodwin, CMA; 2021/04/07 9:22 AM) Anxiety Disorder Colon Cancer Gastroesophageal Reflux Disease     Review of Systems Sydney Goodwin CMA; 04-07-21 9:22 AM) General Not Present- Appetite Loss, Chills, Fatigue, Fever, Night Sweats, Weight Gain and Weight Loss. Skin Not Present- Change in Wart/Mole, Dryness, Hives, Jaundice, New Lesions, Non-Healing Wounds, Rash and Ulcer. HEENT Present- Seasonal Allergies. Not Present- Earache, Hearing Loss, Hoarseness, Nose Bleed, Oral Ulcers, Ringing in the Ears, Sinus Pain, Sore Throat, Visual Disturbances, Wears  glasses/contact lenses and Yellow Eyes. Respiratory Not Present- Bloody sputum, Chronic Cough, Difficulty Breathing, Snoring and Wheezing. Breast Not Present- Breast Mass, Breast Pain, Nipple Discharge and Skin Changes. Cardiovascular Not Present- Chest Pain, Difficulty Breathing Lying Down, Leg Cramps, Palpitations, Rapid Heart Rate, Shortness of Breath and Swelling of Extremities. Gastrointestinal Not Present- Abdominal Pain, Bloating, Bloody Stool, Change in Bowel Habits, Chronic diarrhea, Constipation, Difficulty Swallowing, Excessive gas, Gets full quickly at meals, Hemorrhoids, Indigestion, Nausea, Rectal Pain and Vomiting. Female Genitourinary Not Present- Frequency, Nocturia, Painful Urination, Pelvic Pain and Urgency. Musculoskeletal Not Present- Back Pain, Joint Pain, Joint Stiffness, Muscle Pain, Muscle Weakness and Swelling of Extremities. Neurological Not Present- Decreased Memory, Fainting, Headaches, Numbness, Seizures, Tingling, Tremor, Trouble walking and Weakness. Psychiatric Present- Anxiety. Not Present- Bipolar, Change in Sleep Pattern, Depression, Fearful and Frequent crying. Endocrine Not Present- Cold Intolerance, Excessive Hunger, Hair Changes, Heat Intolerance, Hot flashes and New Diabetes. Hematology Not Present- Blood Thinners, Easy Bruising, Excessive bleeding, Gland problems, HIV and Persistent Infections.  Vitals Sydney Goodwin CMA; 04/07/21 9:28 AM) Apr 07, 2021 9:27 AM Weight: 186.13 lb Height: 65in Body Surface Area: 1.92 m Body Mass Index: 30.97 kg/m  Temp.: 97.65F  Pulse: 107 (Regular)  P.OX: 96% (Room air) BP: 124/68(Sitting, Left Arm, Standard)     04/07/21 9:27 AM Weight: 186 lb        Physical Exam Sydney Ruff MD; 02/05/5497 10:03 AM)  General Mental Status-Alert. General Appearance-Cooperative.  Abdomen Palpation/Percussion Palpation and Percussion of the abdomen reveal - Soft and Non Tender.    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 Was a polyp noted in her sigmoid colon. This was resected with approximately  2 mm margin. It was semi-pedunculated, with no poor differentiation tumor abutting or lymphovascular invasion. Upon placing this in a wrist calculated her she is at high risk with approximately 10-15% chance of residual cancer being in the colon or lymph nodes itself. I have discussed this with her and she has decided to proceed with resection of the sigmoid colon. We will plan on doing a robotic approach. The surgery and anatomy were described to the patient as well as the risks of surgery and the possible complications. These include: Bleeding, deep abdominal infections and possible wound complications such as hernia and infection, damage to adjacent structures, leak of surgical connections, which can lead to other surgeries and possibly an ostomy, possible need for other procedures, such as abscess drains in radiology, possible prolonged hospital stay, possible diarrhea from removal of part of the colon, possible constipation from narcotics, possible bowel, bladder or sexual dysfunction if having rectal surgery, prolonged fatigue/weakness or appetite loss, possible early recurrence of of disease, possible complications of their medical problems such as heart disease or arrhythmias or lung problems, death (less than 1%). I believe the patient understands and wishes to proceed with the surgery.

## 2021-03-14 ENCOUNTER — Other Ambulatory Visit: Payer: Self-pay

## 2021-03-14 ENCOUNTER — Ambulatory Visit (HOSPITAL_COMMUNITY)
Admission: RE | Admit: 2021-03-14 | Discharge: 2021-03-14 | Disposition: A | Discharge status: Home / Self Care | Payer: BC Managed Care – PPO | Source: Ambulatory Visit | Attending: Gastroenterology | Admitting: Gastroenterology

## 2021-03-14 DIAGNOSIS — K769 Liver disease, unspecified: Secondary | ICD-10-CM | POA: Diagnosis not present

## 2021-03-14 DIAGNOSIS — K7689 Other specified diseases of liver: Secondary | ICD-10-CM | POA: Diagnosis not present

## 2021-03-14 MED ORDER — GADOBUTROL 1 MMOL/ML IV SOLN
8.0000 mL | Freq: Once | INTRAVENOUS | Status: AC | PRN
  Administered 2021-03-14: 8 mL via INTRAVENOUS

## 2021-04-16 ENCOUNTER — Encounter (HOSPITAL_COMMUNITY): Payer: BC Managed Care – PPO

## 2021-04-22 ENCOUNTER — Other Ambulatory Visit (HOSPITAL_COMMUNITY): Payer: BC Managed Care – PPO

## 2021-05-26 NOTE — Progress Notes (Signed)
YOU NEED TO HAVE A COVID 19 TEST ON___6/27/22 ____ @_______ , THIS TEST MUST BE DONE BEFORE SURGERY,  COVID TESTING SITE [place] [address], IT IS ON THE RIGHT GOING OUT [address] APPROXIMATELY  2 MINUTES PAST [employer] ON THE RIGHT. ONCE YOUR COVID TEST IS COMPLETED,  PLEASE BEGIN THE QUARANTINE INSTRUCTIONS AS OUTLINED IN YOUR HANDOUT.                Sydney Goodwin  05/26/2021   Your procedure is scheduled on:      06/04/21   Report to [facility] Main  Entrance   Report to admitting at     [phone]     Call this number if you have problems the morning of surgery [phone]           REMEMBER: FOLLOW ALL YOUR BOWEL PREP INSTRUCTIONS WITH CLEAR LIQUIDS FROM YOUR SURGEON'S INSTRUCTIONS. DRINK 2 PRESURGERY ENSURE DRINKS THE NIGHT BEFORE SURGERY AT 1000 PM AND 1 PRESURGERY DRINK THE DAY OF THE PROCEDURE 3 HOURS PRIOR TO SCHEDULED SURGERY.  NOTHING BY MOUTH EXCEPT CLEAR LIQUIDS UNTIL THREE HOURS PRIOR TO SCHEDULED SURGERY. PLEASE FINISH PRESURGERY 3RD  ENSURE  DRINK PER SURGEON ORDER 3 HOURS PRIOR TO SCHEDULED SURGERY TIME WHICH NEEDS TO BE COMPLETED AT _________.     [id number]    CLEAR LIQUID DIET   Foods Allowed                                                                      Coffee and tea, regular and decaf                           Plain Jell-O any favor except red or purple                                         Fruit ices (not with fruit pulp)                                      Iced Popsicles                                     Carbonated beverages, regular and diet                                    Cranberry, grape and apple juices Sports drinks like Gatorade Lightly seasoned clear broth or consume(fat free) Sugar, honey syrup  _____________________________________________________________________      BRUSH YOUR TEETH MORNING OF SURGERY AND RINSE YOUR MOUTH OUT, NO CHEWING GUM CANDY OR MINTS.     Take these  medicines the morning of surgery with A SIP OF WATER:    None  DO NOT TAKE ANY DIABETIC MEDICATIONS DAY OF YOUR SURGERY  You may not have any metal on your body including hair pins and              piercings  Do not wear jewelry, make-up, lotions, powders or perfumes, deodorant             Do not wear nail polish on your fingernails.  Do not shave  48 hours prior to surgery.              Men may shave face and neck.   Do not bring valuables to the hospital. [place].  Contacts, dentures or bridgework may not be worn into surgery.  Leave suitcase in the car. After surgery it may be brought to your room.                 Please read over the following fact sheets you were given: _____________________________________________________________________  [facility] - Preparing for Surgery Before surgery, you can play an important role.  Because skin is not sterile, your skin needs to be as free of germs as possible.  You can reduce the number of germs on your skin by washing with CHG (chlorahexidine gluconate) soap before surgery.  CHG is an antiseptic cleaner which kills germs and bonds with the skin to continue killing germs even after washing. Please DO NOT use if you have an allergy to CHG or antibacterial soaps.  If your skin becomes reddened/irritated stop using the CHG and inform your nurse when you arrive at Short Stay. Do not shave (including legs and underarms) for at least 48 hours prior to the first CHG shower.  You may shave your face/neck. Please follow these instructions carefully:  1.  Shower with CHG Soap the night before surgery and the  morning of Surgery.  2.  If you choose to wash your hair, wash your hair first as usual with your  normal  shampoo.  3.  After you shampoo, rinse your hair and body thoroughly to remove the  shampoo.                           4.  Use CHG as you would any other liquid  soap.  You can apply chg directly  to the skin and wash                       Gently with a scrungie or clean washcloth.  5.  Apply the CHG Soap to your body ONLY FROM THE NECK DOWN.   Do not use on face/ open                           Wound or open sores. Avoid contact with eyes, ears mouth and genitals (private parts).                       Wash face,  Genitals (private parts) with your normal soap.             6.  Wash thoroughly, paying special attention to the area where your surgery  will be performed.  7.  Thoroughly rinse your body with warm water from the neck down.  8.  DO NOT shower/wash with your normal soap after using and rinsing off  the CHG Soap.  9.  Pat yourself dry with a clean towel.            10.  Wear clean pajamas.            11.  Place clean sheets on your bed the night of your first shower and do not  sleep with pets. Day of Surgery : Do not apply any lotions/deodorants the morning of surgery.  Please wear clean clothes to the hospital/surgery center.  FAILURE TO FOLLOW THESE INSTRUCTIONS MAY RESULT IN THE CANCELLATION OF YOUR SURGERY PATIENT SIGNATURE_________________________________  NURSE SIGNATURE__________________________________  ________________________________________________________________________             5am ______.

## 2021-05-28 ENCOUNTER — Encounter (HOSPITAL_COMMUNITY): Payer: Self-pay

## 2021-05-28 ENCOUNTER — Other Ambulatory Visit: Payer: Self-pay

## 2021-05-28 ENCOUNTER — Encounter (HOSPITAL_COMMUNITY)
Admission: RE | Admit: 2021-05-28 | Discharge: 2021-05-28 | Disposition: A | Discharge status: Home / Self Care | Payer: BC Managed Care – PPO | Source: Ambulatory Visit | Attending: General Surgery | Admitting: General Surgery

## 2021-05-28 DIAGNOSIS — Z01812 Encounter for preprocedural laboratory examination: Secondary | ICD-10-CM | POA: Diagnosis not present

## 2021-05-28 HISTORY — DX: Malignant (primary) neoplasm, unspecified: C80.1

## 2021-05-28 LAB — CBC
HCT: 39.8 % (ref 36.0–46.0)
Hemoglobin: 13.3 g/dL (ref 12.0–15.0)
MCH: 31.1 pg (ref 26.0–34.0)
MCHC: 33.4 g/dL (ref 30.0–36.0)
MCV: 93 fL (ref 80.0–100.0)
Platelets: 264 10*3/uL (ref 150–400)
RBC: 4.28 MIL/uL (ref 3.87–5.11)
RDW: 12.6 % (ref 11.5–15.5)
WBC: 6.6 10*3/uL (ref 4.0–10.5)
nRBC: 0 % (ref 0.0–0.2)

## 2021-06-02 ENCOUNTER — Other Ambulatory Visit (HOSPITAL_COMMUNITY)
Admission: RE | Admit: 2021-06-02 | Discharge: 2021-06-02 | Disposition: A | Discharge status: Home / Self Care | Payer: BC Managed Care – PPO | Source: Ambulatory Visit | Attending: General Surgery | Admitting: General Surgery

## 2021-06-02 DIAGNOSIS — Z20822 Contact with and (suspected) exposure to covid-19: Secondary | ICD-10-CM | POA: Diagnosis not present

## 2021-06-02 DIAGNOSIS — K219 Gastro-esophageal reflux disease without esophagitis: Secondary | ICD-10-CM | POA: Diagnosis not present

## 2021-06-02 DIAGNOSIS — Z880 Allergy status to penicillin: Secondary | ICD-10-CM | POA: Diagnosis not present

## 2021-06-02 DIAGNOSIS — F418 Other specified anxiety disorders: Secondary | ICD-10-CM | POA: Diagnosis not present

## 2021-06-02 DIAGNOSIS — Z885 Allergy status to narcotic agent status: Secondary | ICD-10-CM | POA: Diagnosis not present

## 2021-06-02 DIAGNOSIS — Z888 Allergy status to other drugs, medicaments and biological substances status: Secondary | ICD-10-CM | POA: Diagnosis not present

## 2021-06-02 DIAGNOSIS — Z01812 Encounter for preprocedural laboratory examination: Secondary | ICD-10-CM | POA: Insufficient documentation

## 2021-06-02 DIAGNOSIS — I8392 Asymptomatic varicose veins of left lower extremity: Secondary | ICD-10-CM | POA: Diagnosis not present

## 2021-06-02 DIAGNOSIS — C187 Malignant neoplasm of sigmoid colon: Secondary | ICD-10-CM | POA: Diagnosis not present

## 2021-06-02 DIAGNOSIS — Z800 Family history of malignant neoplasm of digestive organs: Secondary | ICD-10-CM | POA: Diagnosis not present

## 2021-06-03 LAB — SARS CORONAVIRUS 2 (TAT 6-24 HRS): SARS Coronavirus 2: NEGATIVE

## 2021-06-03 MED ORDER — BUPIVACAINE LIPOSOME 1.3 % IJ SUSP
20.0000 mL | Freq: Once | INTRAMUSCULAR | Status: DC
  Filled 2021-06-03: qty 20

## 2021-06-03 NOTE — Anesthesia Preprocedure Evaluation (Addendum)
Anesthesia Evaluation  Patient identified by MRN, date of birth, ID band Patient awake    Reviewed: Allergy & Precautions, NPO status , Patient's Chart, lab work & pertinent test results  History of Anesthesia Complications Negative for: history of anesthetic complications  Airway Mallampati: II  TM Distance: >3 FB Neck ROM: Full    Dental no notable dental hx. (+) Dental Advisory Given   Pulmonary neg pulmonary ROS,    Pulmonary exam normal        Cardiovascular negative cardio ROS Normal cardiovascular exam     Neuro/Psych PSYCHIATRIC DISORDERS Anxiety Depression negative neurological ROS     GI/Hepatic Neg liver ROS, GERD  ,Colon Ca   Endo/Other  negative endocrine ROS  Renal/GU negative Renal ROS     Musculoskeletal negative musculoskeletal ROS (+)   Abdominal   Peds  Hematology negative hematology ROS (+)   Anesthesia Other Findings   Reproductive/Obstetrics                            Anesthesia Physical Anesthesia Plan  ASA: 2  Anesthesia Plan: General   Post-op Pain Management:    Induction: Intravenous  PONV Risk Score and Plan: 4 or greater and Ondansetron, Dexamethasone, Midazolam and Scopolamine patch - Pre-op  Airway Management Planned: Oral ETT  Additional Equipment:   Intra-op Plan:   Post-operative Plan: Extubation in OR  Informed Consent: I have reviewed the patients History and Physical, chart, labs and discussed the procedure including the risks, benefits and alternatives for the proposed anesthesia with the patient or authorized representative who has indicated his/her understanding and acceptance.     Dental advisory given  Plan Discussed with: Anesthesiologist and CRNA  Anesthesia Plan Comments:        Anesthesia Quick Evaluation

## 2021-06-04 ENCOUNTER — Encounter (HOSPITAL_COMMUNITY): Payer: Self-pay | Admitting: General Surgery

## 2021-06-04 ENCOUNTER — Encounter (HOSPITAL_COMMUNITY)
Admission: RE | Disposition: A | Discharge status: Home / Self Care | Payer: Self-pay | Source: Home / Self Care | Attending: General Surgery

## 2021-06-04 ENCOUNTER — Inpatient Hospital Stay (HOSPITAL_COMMUNITY): Payer: BC Managed Care – PPO | Admitting: Anesthesiology

## 2021-06-04 ENCOUNTER — Inpatient Hospital Stay (HOSPITAL_COMMUNITY)
Admission: RE | Admit: 2021-06-04 | Discharge: 2021-06-06 | DRG: 331 | Disposition: A | Discharge status: Home / Self Care | Payer: BC Managed Care – PPO | Attending: General Surgery | Admitting: General Surgery

## 2021-06-04 DIAGNOSIS — Z888 Allergy status to other drugs, medicaments and biological substances status: Secondary | ICD-10-CM | POA: Diagnosis not present

## 2021-06-04 DIAGNOSIS — C187 Malignant neoplasm of sigmoid colon: Principal | ICD-10-CM | POA: Diagnosis present

## 2021-06-04 DIAGNOSIS — Z885 Allergy status to narcotic agent status: Secondary | ICD-10-CM | POA: Diagnosis not present

## 2021-06-04 DIAGNOSIS — Z20822 Contact with and (suspected) exposure to covid-19: Secondary | ICD-10-CM | POA: Diagnosis present

## 2021-06-04 DIAGNOSIS — Z800 Family history of malignant neoplasm of digestive organs: Secondary | ICD-10-CM

## 2021-06-04 DIAGNOSIS — Z880 Allergy status to penicillin: Secondary | ICD-10-CM | POA: Diagnosis not present

## 2021-06-04 LAB — TYPE AND SCREEN
ABO/RH(D): A POS
Antibody Screen: NEGATIVE

## 2021-06-04 LAB — HCG, SERUM, QUALITATIVE: Preg, Serum: NEGATIVE

## 2021-06-04 SURGERY — COLECTOMY, PARTIAL, ROBOT-ASSISTED, LAPAROSCOPIC
Anesthesia: General | Site: Abdomen

## 2021-06-04 MED ORDER — DIPHENHYDRAMINE HCL 25 MG PO TABS
50.0000 mg | ORAL_TABLET | Freq: Every evening | ORAL | Status: DC | PRN
  Filled 2021-06-04: qty 2

## 2021-06-04 MED ORDER — LACTATED RINGERS IV SOLN: INTRAVENOUS | Status: DC

## 2021-06-04 MED ORDER — FENTANYL CITRATE (PF) 100 MCG/2ML IJ SOLN
INTRAMUSCULAR | Status: DC | PRN
  Administered 2021-06-04: 100 ug via INTRAVENOUS

## 2021-06-04 MED ORDER — FENTANYL CITRATE (PF) 100 MCG/2ML IJ SOLN
25.0000 ug | INTRAMUSCULAR | Status: DC | PRN
  Administered 2021-06-04: 50 ug via INTRAVENOUS

## 2021-06-04 MED ORDER — MIDAZOLAM HCL 5 MG/5ML IJ SOLN
INTRAMUSCULAR | Status: DC | PRN
  Administered 2021-06-04: 2 mg via INTRAVENOUS

## 2021-06-04 MED ORDER — ONDANSETRON HCL 4 MG/2ML IJ SOLN
INTRAMUSCULAR | Status: DC | PRN
  Administered 2021-06-04: 4 mg via INTRAVENOUS

## 2021-06-04 MED ORDER — PROPOFOL 10 MG/ML IV BOLUS
INTRAVENOUS | Status: AC
  Filled 2021-06-04: qty 20

## 2021-06-04 MED ORDER — BUPIVACAINE-EPINEPHRINE 0.25% -1:200000 IJ SOLN
INTRAMUSCULAR | Status: DC | PRN
  Administered 2021-06-04: 30 mL

## 2021-06-04 MED ORDER — SIMETHICONE 80 MG PO CHEW: 40.0000 mg | CHEWABLE_TABLET | Freq: Four times a day (QID) | ORAL | Status: DC | PRN

## 2021-06-04 MED ORDER — GABAPENTIN 300 MG PO CAPS
300.0000 mg | ORAL_CAPSULE | Freq: Two times a day (BID) | ORAL | Status: DC
  Administered 2021-06-04 – 2021-06-06 (×4): 300 mg via ORAL
  Filled 2021-06-04 (×4): qty 1

## 2021-06-04 MED ORDER — DIPHENHYDRAMINE HCL 25 MG PO CAPS: 25.0000 mg | ORAL_CAPSULE | Freq: Four times a day (QID) | ORAL | Status: DC | PRN

## 2021-06-04 MED ORDER — ONDANSETRON HCL 4 MG/2ML IJ SOLN
4.0000 mg | Freq: Four times a day (QID) | INTRAMUSCULAR | Status: DC | PRN
  Administered 2021-06-04 – 2021-06-05 (×2): 4 mg via INTRAVENOUS
  Filled 2021-06-04 (×2): qty 2

## 2021-06-04 MED ORDER — FENTANYL CITRATE (PF) 100 MCG/2ML IJ SOLN
INTRAMUSCULAR | Status: AC
  Administered 2021-06-04: 50 ug via INTRAVENOUS
  Filled 2021-06-04: qty 2

## 2021-06-04 MED ORDER — LACTATED RINGERS IR SOLN
Status: DC | PRN
  Administered 2021-06-04: 1000 mL

## 2021-06-04 MED ORDER — ONDANSETRON HCL 4 MG/2ML IJ SOLN
INTRAMUSCULAR | Status: AC
  Filled 2021-06-04: qty 2

## 2021-06-04 MED ORDER — PHENYLEPHRINE 40 MCG/ML (10ML) SYRINGE FOR IV PUSH (FOR BLOOD PRESSURE SUPPORT)
PREFILLED_SYRINGE | INTRAVENOUS | Status: DC | PRN
  Administered 2021-06-04 (×3): 80 ug via INTRAVENOUS

## 2021-06-04 MED ORDER — BUPIVACAINE-EPINEPHRINE (PF) 0.25% -1:200000 IJ SOLN
INTRAMUSCULAR | Status: AC
  Filled 2021-06-04: qty 30

## 2021-06-04 MED ORDER — LIDOCAINE 2% (20 MG/ML) 5 ML SYRINGE
INTRAMUSCULAR | Status: DC | PRN
  Administered 2021-06-04: 100 mg via INTRAVENOUS

## 2021-06-04 MED ORDER — DEXAMETHASONE SODIUM PHOSPHATE 10 MG/ML IJ SOLN
INTRAMUSCULAR | Status: DC | PRN
  Administered 2021-06-04: 10 mg via INTRAVENOUS

## 2021-06-04 MED ORDER — ALUM & MAG HYDROXIDE-SIMETH 200-200-20 MG/5ML PO SUSP
30.0000 mL | Freq: Four times a day (QID) | ORAL | Status: DC | PRN
  Administered 2021-06-06: 30 mL via ORAL
  Filled 2021-06-04: qty 30

## 2021-06-04 MED ORDER — DIPHENHYDRAMINE HCL 50 MG/ML IJ SOLN: 25.0000 mg | Freq: Four times a day (QID) | INTRAMUSCULAR | Status: DC | PRN

## 2021-06-04 MED ORDER — ACETAMINOPHEN 500 MG PO TABS
1000.0000 mg | ORAL_TABLET | Freq: Four times a day (QID) | ORAL | Status: DC
  Administered 2021-06-04: 1000 mg via ORAL
  Filled 2021-06-04: qty 2

## 2021-06-04 MED ORDER — ONDANSETRON HCL 4 MG PO TABS: 4.0000 mg | ORAL_TABLET | Freq: Four times a day (QID) | ORAL | Status: DC | PRN

## 2021-06-04 MED ORDER — 0.9 % SODIUM CHLORIDE (POUR BTL) OPTIME
TOPICAL | Status: DC | PRN
  Administered 2021-06-04: 2000 mL

## 2021-06-04 MED ORDER — ACETAMINOPHEN 10 MG/ML IV SOLN
1000.0000 mg | Freq: Four times a day (QID) | INTRAVENOUS | Status: DC
  Administered 2021-06-05 (×2): 1000 mg via INTRAVENOUS
  Filled 2021-06-04 (×3): qty 100

## 2021-06-04 MED ORDER — HYDROMORPHONE HCL 1 MG/ML IJ SOLN
0.5000 mg | INTRAMUSCULAR | Status: DC | PRN
  Administered 2021-06-04: 0.5 mg via INTRAVENOUS
  Filled 2021-06-04: qty 0.5

## 2021-06-04 MED ORDER — MIDAZOLAM HCL 2 MG/2ML IJ SOLN
INTRAMUSCULAR | Status: AC
  Filled 2021-06-04: qty 2

## 2021-06-04 MED ORDER — CELECOXIB 200 MG PO CAPS
200.0000 mg | ORAL_CAPSULE | Freq: Once | ORAL | Status: AC
  Administered 2021-06-04: 200 mg via ORAL
  Filled 2021-06-04: qty 1

## 2021-06-04 MED ORDER — ENSURE SURGERY PO LIQD: 237.0000 mL | Freq: Two times a day (BID) | ORAL | Status: DC

## 2021-06-04 MED ORDER — KETAMINE HCL 10 MG/ML IJ SOLN
INTRAMUSCULAR | Status: AC
  Filled 2021-06-04: qty 1

## 2021-06-04 MED ORDER — PROMETHAZINE HCL 25 MG/ML IJ SOLN: 6.2500 mg | INTRAMUSCULAR | Status: DC | PRN

## 2021-06-04 MED ORDER — ENOXAPARIN SODIUM 40 MG/0.4ML IJ SOSY
40.0000 mg | PREFILLED_SYRINGE | INTRAMUSCULAR | Status: DC
  Administered 2021-06-05 – 2021-06-06 (×2): 40 mg via SUBCUTANEOUS
  Filled 2021-06-04 (×2): qty 0.4

## 2021-06-04 MED ORDER — GENTAMICIN SULFATE 40 MG/ML IJ SOLN
5.0000 mg/kg | INTRAVENOUS | Status: AC
  Administered 2021-06-04: 340 mg via INTRAVENOUS
  Filled 2021-06-04: qty 8.5

## 2021-06-04 MED ORDER — LIDOCAINE HCL (PF) 2 % IJ SOLN
INTRAMUSCULAR | Status: DC | PRN
  Administered 2021-06-04: 1 mg/kg/h via INTRADERMAL

## 2021-06-04 MED ORDER — FENTANYL CITRATE (PF) 100 MCG/2ML IJ SOLN
INTRAMUSCULAR | Status: AC
  Filled 2021-06-04: qty 2

## 2021-06-04 MED ORDER — ROCURONIUM BROMIDE 10 MG/ML (PF) SYRINGE
PREFILLED_SYRINGE | INTRAVENOUS | Status: AC
  Filled 2021-06-04: qty 10

## 2021-06-04 MED ORDER — VENLAFAXINE HCL ER 150 MG PO CP24
150.0000 mg | ORAL_CAPSULE | Freq: Every day | ORAL | Status: DC
  Administered 2021-06-05 – 2021-06-06 (×2): 150 mg via ORAL
  Filled 2021-06-04 (×2): qty 1

## 2021-06-04 MED ORDER — DEXAMETHASONE SODIUM PHOSPHATE 10 MG/ML IJ SOLN
INTRAMUSCULAR | Status: AC
  Filled 2021-06-04: qty 1

## 2021-06-04 MED ORDER — ROCURONIUM BROMIDE 10 MG/ML (PF) SYRINGE
PREFILLED_SYRINGE | INTRAVENOUS | Status: DC | PRN
  Administered 2021-06-04: 100 mg via INTRAVENOUS

## 2021-06-04 MED ORDER — AMISULPRIDE (ANTIEMETIC) 5 MG/2ML IV SOLN: 10.0000 mg | Freq: Once | INTRAVENOUS | Status: DC | PRN

## 2021-06-04 MED ORDER — ORAL CARE MOUTH RINSE: 15.0000 mL | Freq: Once | OROMUCOSAL | Status: AC

## 2021-06-04 MED ORDER — ACETAMINOPHEN 500 MG PO TABS
1000.0000 mg | ORAL_TABLET | ORAL | Status: AC
  Administered 2021-06-04: 1000 mg via ORAL
  Filled 2021-06-04: qty 2

## 2021-06-04 MED ORDER — EPHEDRINE SULFATE-NACL 50-0.9 MG/10ML-% IV SOSY
PREFILLED_SYRINGE | INTRAVENOUS | Status: DC | PRN
  Administered 2021-06-04 (×2): 10 mg via INTRAVENOUS

## 2021-06-04 MED ORDER — CHLORHEXIDINE GLUCONATE 0.12 % MT SOLN
15.0000 mL | Freq: Once | OROMUCOSAL | Status: AC
  Administered 2021-06-04: 15 mL via OROMUCOSAL

## 2021-06-04 MED ORDER — PROPOFOL 10 MG/ML IV BOLUS
INTRAVENOUS | Status: DC | PRN
  Administered 2021-06-04: 200 mg via INTRAVENOUS

## 2021-06-04 MED ORDER — ALVIMOPAN 12 MG PO CAPS
12.0000 mg | ORAL_CAPSULE | ORAL | Status: AC
  Administered 2021-06-04: 12 mg via ORAL
  Filled 2021-06-04: qty 1

## 2021-06-04 MED ORDER — ENSURE PRE-SURGERY PO LIQD
592.0000 mL | Freq: Once | ORAL | Status: DC
  Filled 2021-06-04: qty 592

## 2021-06-04 MED ORDER — LIDOCAINE 2% (20 MG/ML) 5 ML SYRINGE
INTRAMUSCULAR | Status: AC
  Filled 2021-06-04: qty 5

## 2021-06-04 MED ORDER — HEPARIN SODIUM (PORCINE) 5000 UNIT/ML IJ SOLN
5000.0000 [IU] | Freq: Once | INTRAMUSCULAR | Status: AC
  Administered 2021-06-04: 5000 [IU] via SUBCUTANEOUS
  Filled 2021-06-04: qty 1

## 2021-06-04 MED ORDER — CLINDAMYCIN PHOSPHATE 900 MG/50ML IV SOLN
900.0000 mg | INTRAVENOUS | Status: AC
  Administered 2021-06-04: 900 mg via INTRAVENOUS
  Filled 2021-06-04: qty 50

## 2021-06-04 MED ORDER — KETAMINE HCL 10 MG/ML IJ SOLN
INTRAMUSCULAR | Status: DC | PRN
  Administered 2021-06-04: 30 mg via INTRAVENOUS

## 2021-06-04 MED ORDER — KCL IN DEXTROSE-NACL 20-5-0.45 MEQ/L-%-% IV SOLN
INTRAVENOUS | Status: DC
  Filled 2021-06-04: qty 1000

## 2021-06-04 MED ORDER — PHENYLEPHRINE 40 MCG/ML (10ML) SYRINGE FOR IV PUSH (FOR BLOOD PRESSURE SUPPORT)
PREFILLED_SYRINGE | INTRAVENOUS | Status: AC
  Filled 2021-06-04: qty 20

## 2021-06-04 MED ORDER — BUPIVACAINE LIPOSOME 1.3 % IJ SUSP
INTRAMUSCULAR | Status: DC | PRN
  Administered 2021-06-04: 20 mL

## 2021-06-04 MED ORDER — ENSURE PRE-SURGERY PO LIQD
296.0000 mL | Freq: Once | ORAL | Status: DC
  Filled 2021-06-04: qty 296

## 2021-06-04 MED ORDER — ALVIMOPAN 12 MG PO CAPS
12.0000 mg | ORAL_CAPSULE | Freq: Two times a day (BID) | ORAL | Status: DC
  Administered 2021-06-05 – 2021-06-06 (×3): 12 mg via ORAL
  Filled 2021-06-04 (×3): qty 1

## 2021-06-04 MED ORDER — SACCHAROMYCES BOULARDII 250 MG PO CAPS
250.0000 mg | ORAL_CAPSULE | Freq: Two times a day (BID) | ORAL | Status: DC
  Administered 2021-06-04 – 2021-06-06 (×4): 250 mg via ORAL
  Filled 2021-06-04 (×4): qty 1

## 2021-06-04 MED ORDER — LIDOCAINE HCL 2 % IJ SOLN
INTRAMUSCULAR | Status: AC
  Filled 2021-06-04: qty 20

## 2021-06-04 MED ORDER — SCOPOLAMINE 1 MG/3DAYS TD PT72
1.0000 | MEDICATED_PATCH | TRANSDERMAL | Status: DC
  Administered 2021-06-04: 1.5 mg via TRANSDERMAL
  Filled 2021-06-04: qty 1

## 2021-06-04 MED ORDER — PHENYLEPHRINE HCL (PRESSORS) 10 MG/ML IV SOLN
INTRAVENOUS | Status: AC
  Filled 2021-06-04: qty 1

## 2021-06-04 MED ORDER — TRAMADOL HCL 50 MG PO TABS
50.0000 mg | ORAL_TABLET | Freq: Four times a day (QID) | ORAL | Status: DC | PRN
Start: 2021-06-04 — End: 2021-06-05
  Administered 2021-06-04 – 2021-06-05 (×2): 50 mg via ORAL
  Filled 2021-06-04 (×2): qty 1

## 2021-06-04 SURGICAL SUPPLY — 91 items
BAG COUNTER SPONGE SURGICOUNT (BAG) ×2 IMPLANT
BAG SPNG CNTER NS LX DISP (BAG) ×1
BLADE EXTENDED COATED 6.5IN (ELECTRODE) IMPLANT
CANNULA REDUC XI 12-8 STAPL (CANNULA)
CANNULA REDUCER 12-8 DVNC XI (CANNULA) IMPLANT
CELLS DAT CNTRL 66122 CELL SVR (MISCELLANEOUS) IMPLANT
COVER SURGICAL LIGHT HANDLE (MISCELLANEOUS) ×4 IMPLANT
COVER TIP SHEARS 8 DVNC (MISCELLANEOUS) ×1 IMPLANT
COVER TIP SHEARS 8MM DA VINCI (MISCELLANEOUS) ×2
DECANTER SPIKE VIAL GLASS SM (MISCELLANEOUS) ×1 IMPLANT
DRAIN CHANNEL 19F RND (DRAIN) IMPLANT
DRAPE ARM DVNC X/XI (DISPOSABLE) ×4 IMPLANT
DRAPE COLUMN DVNC XI (DISPOSABLE) ×1 IMPLANT
DRAPE DA VINCI XI ARM (DISPOSABLE) ×8
DRAPE DA VINCI XI COLUMN (DISPOSABLE) ×2
DRAPE SURG IRRIG POUCH 19X23 (DRAPES) ×2 IMPLANT
DRSG OPSITE POSTOP 4X10 (GAUZE/BANDAGES/DRESSINGS) IMPLANT
DRSG OPSITE POSTOP 4X6 (GAUZE/BANDAGES/DRESSINGS) ×1 IMPLANT
DRSG OPSITE POSTOP 4X8 (GAUZE/BANDAGES/DRESSINGS) IMPLANT
ELECT PENCIL ROCKER SW 15FT (MISCELLANEOUS) ×2 IMPLANT
ELECT REM PT RETURN 15FT ADLT (MISCELLANEOUS) ×2 IMPLANT
ENDOLOOP SUT PDS II  0 18 (SUTURE)
ENDOLOOP SUT PDS II 0 18 (SUTURE) IMPLANT
EVACUATOR SILICONE 100CC (DRAIN) IMPLANT
GLOVE SURG ENC MOIS LTX SZ6.5 (GLOVE) ×6 IMPLANT
GLOVE SURG UNDER POLY LF SZ7 (GLOVE) ×4 IMPLANT
GOWN STRL REUS W/TWL XL LVL3 (GOWN DISPOSABLE) ×6 IMPLANT
GRASPER SUT TROCAR 14GX15 (MISCELLANEOUS) IMPLANT
HOLDER FOLEY CATH W/STRAP (MISCELLANEOUS) ×2 IMPLANT
IRRIG SUCT STRYKERFLOW 2 WTIP (MISCELLANEOUS) ×2
IRRIGATION SUCT STRKRFLW 2 WTP (MISCELLANEOUS) ×1 IMPLANT
KIT PROCEDURE DA VINCI SI (MISCELLANEOUS)
KIT PROCEDURE DVNC SI (MISCELLANEOUS) IMPLANT
KIT TURNOVER KIT A (KITS) ×2 IMPLANT
NDL INSUFFLATION 14GA 120MM (NEEDLE) ×1 IMPLANT
NEEDLE INSUFFLATION 14GA 120MM (NEEDLE) ×2 IMPLANT
PACK CARDIOVASCULAR III (CUSTOM PROCEDURE TRAY) ×2 IMPLANT
PACK COLON (CUSTOM PROCEDURE TRAY) ×2 IMPLANT
PAD POSITIONING PINK XL (MISCELLANEOUS) ×2 IMPLANT
PORT LAP GEL ALEXIS MED 5-9CM (MISCELLANEOUS) IMPLANT
POUCH SELF SEAL 6IN X 16.5IN (STERILIZATION PRODUCTS) IMPLANT
RELOAD STAPLE 60 3.5 BLU DVNC (STAPLE) IMPLANT
RELOAD STAPLE 60 4.3 GRN DVNC (STAPLE) IMPLANT
RELOAD STAPLER 3.5X60 BLU DVNC (STAPLE) ×1 IMPLANT
RELOAD STAPLER 4.3X60 GRN DVNC (STAPLE) IMPLANT
RETRACTOR WND ALEXIS 18 MED (MISCELLANEOUS) IMPLANT
RTRCTR WOUND ALEXIS 18CM MED (MISCELLANEOUS)
SCISSORS LAP 5X35 DISP (ENDOMECHANICALS) IMPLANT
SEAL CANN UNIV 5-8 DVNC XI (MISCELLANEOUS) ×3 IMPLANT
SEAL XI 5MM-8MM UNIVERSAL (MISCELLANEOUS) ×6
SEALER VESSEL DA VINCI XI (MISCELLANEOUS) ×2
SEALER VESSEL EXT DVNC XI (MISCELLANEOUS) ×1 IMPLANT
SOLUTION ELECTROLUBE (MISCELLANEOUS) ×2 IMPLANT
SPONGE T-LAP 18X18 ~~LOC~~+RFID (SPONGE) ×1 IMPLANT
STAPLER 60 DA VINCI SURE FORM (STAPLE) ×2
STAPLER 60 SUREFORM DVNC (STAPLE) IMPLANT
STAPLER CANNULA SEAL DVNC XI (STAPLE) IMPLANT
STAPLER CANNULA SEAL XI (STAPLE)
STAPLER ECHELON POWER CIR 29 (STAPLE) ×1 IMPLANT
STAPLER ECHELON POWER CIR 31 (STAPLE) IMPLANT
STAPLER RELOAD 3.5X60 BLU DVNC (STAPLE) ×1
STAPLER RELOAD 3.5X60 BLUE (STAPLE) ×2
STAPLER RELOAD 4.3X60 GREEN (STAPLE)
STAPLER RELOAD 4.3X60 GRN DVNC (STAPLE)
STOPCOCK 4 WAY LG BORE MALE ST (IV SETS) ×4 IMPLANT
SUT ETHILON 2 0 PS N (SUTURE) IMPLANT
SUT NOVA NAB DX-16 0-1 5-0 T12 (SUTURE) ×4 IMPLANT
SUT PROLENE 2 0 KS (SUTURE) IMPLANT
SUT SILK 2 0 (SUTURE) ×2
SUT SILK 2 0 SH CR/8 (SUTURE) IMPLANT
SUT SILK 2-0 18XBRD TIE 12 (SUTURE) ×1 IMPLANT
SUT SILK 3 0 (SUTURE)
SUT SILK 3 0 SH CR/8 (SUTURE) ×2 IMPLANT
SUT SILK 3-0 18XBRD TIE 12 (SUTURE) IMPLANT
SUT V-LOC BARB 180 2/0GR6 GS22 (SUTURE)
SUT VIC AB 2-0 SH 18 (SUTURE) IMPLANT
SUT VIC AB 2-0 SH 27 (SUTURE) ×2
SUT VIC AB 2-0 SH 27X BRD (SUTURE) IMPLANT
SUT VIC AB 3-0 SH 18 (SUTURE) IMPLANT
SUT VIC AB 4-0 PS2 27 (SUTURE) ×4 IMPLANT
SUT VICRYL 0 UR6 27IN ABS (SUTURE) ×2 IMPLANT
SUTURE V-LC BRB 180 2/0GR6GS22 (SUTURE) IMPLANT
SYR 10ML ECCENTRIC (SYRINGE) ×2 IMPLANT
SYS LAPSCP GELPORT 120MM (MISCELLANEOUS) ×2
SYSTEM LAPSCP GELPORT 120MM (MISCELLANEOUS) IMPLANT
TOWEL OR 17X26 10 PK STRL BLUE (TOWEL DISPOSABLE) ×1 IMPLANT
TOWEL OR NON WOVEN STRL DISP B (DISPOSABLE) ×2 IMPLANT
TRAY FOLEY MTR SLVR 14FR STAT (SET/KITS/TRAYS/PACK) ×1 IMPLANT
TROCAR ADV FIXATION 5X100MM (TROCAR) ×2 IMPLANT
TUBING CONNECTING 10 (TUBING) ×4 IMPLANT
TUBING INSUFFLATION 10FT LAP (TUBING) ×2 IMPLANT

## 2021-06-04 NOTE — Anesthesia Postprocedure Evaluation (Signed)
Anesthesia Post Note  Patient: Sydney Goodwin  Procedure(s) Performed: XI ROBOT ASSISTED LAPAROSCOPIC PARTIAL COLECTOMY (Abdomen)     Patient location during evaluation: PACU Anesthesia Type: General Level of consciousness: sedated Pain management: pain level controlled Vital Signs Assessment: post-procedure vital signs reviewed and stable Respiratory status: spontaneous breathing and respiratory function stable Cardiovascular status: stable Postop Assessment: no apparent nausea or vomiting Anesthetic complications: no   No notable events documented.  Last Vitals:  Vitals:   06/04/21 1130 06/04/21 1145  BP: 124/82 130/77  Pulse: (!) 109 (!) 103  Resp:    Temp:  (!) 36.3 C  SpO2: 95% 93%    Last Pain:  Vitals:   06/04/21 1145  TempSrc:   PainSc: 0-No pain                 [name]

## 2021-06-04 NOTE — Anesthesia Procedure Notes (Signed)
Procedure Name: Intubation Date/Time: 06/04/2021 8:40 AM Performed by: Gerald Leitz, CRNA Pre-anesthesia Checklist: Patient identified, Patient being monitored, Timeout performed, Emergency Drugs available and Suction available Patient Re-evaluated:Patient Re-evaluated prior to induction Oxygen Delivery Method: Circle system utilized Preoxygenation: Pre-oxygenation with 100% oxygen Induction Type: IV induction Ventilation: Mask ventilation without difficulty Laryngoscope Size: Mac and 3 Grade View: Grade I Tube type: Oral Tube size: 7.0 mm Number of attempts: 1 Airway Equipment and Method: Stylet Placement Confirmation: ETT inserted through vocal cords under direct vision, positive ETCO2 and breath sounds checked- equal and bilateral Secured at: 22 cm Tube secured with: Tape Dental Injury: Teeth and Oropharynx as per pre-operative assessment

## 2021-06-04 NOTE — Transfer of Care (Signed)
Immediate Anesthesia Transfer of Care Note  Patient: Sydney Goodwin  Procedure(s) Performed: Procedure(s): XI ROBOT ASSISTED LAPAROSCOPIC PARTIAL COLECTOMY (N/A)  Patient Location: PACU  Anesthesia Type:General  Level of Consciousness: Alert, Awake, Oriented  Airway & Oxygen Therapy: Patient Spontanous Breathing  Post-op Assessment: Report given to RN  Post vital signs: Reviewed and stable  Last Vitals:  Vitals:   06/04/21 0717  BP: (!) 147/99  Pulse: 93  Resp: 15  Temp: 36.9 C  SpO2: 790%    Complications: No apparent anesthesia complications

## 2021-06-04 NOTE — Op Note (Signed)
06/04/2021  10:39 AM  PATIENT:  Sydney Goodwin  45 y.o. female  Patient Care Team: Patient, No Pcp Per (Inactive) as PCP - General (General Practice)  PRE-OPERATIVE DIAGNOSIS:  COLON CANCER  POST-OPERATIVE DIAGNOSIS:  COLON CANCER  PROCEDURE:   XI ROBOT ASSISTED SIGMOID COLECTOMY    Surgeon(s): [name], MD Ileana Roup, MD  ASSISTANT: Dr Dema Severin   ANESTHESIA:   local and general  EBL: 36ml  Total I/O In: 1158.5 [I.V.:1000; IV Piggyback:158.5] Out: -   Delay start of Pharmacological VTE agent (>24hrs) due to surgical blood loss or risk of bleeding:  no  DRAINS: none   SPECIMEN:  Source of Specimen:  Sigmoid colon  DISPOSITION OF SPECIMEN:  PATHOLOGY  COUNTS:  YES  PLAN OF CARE: Admit to inpatient   PATIENT DISPOSITION:  PACU - hemodynamically stable.  INDICATION:    45 y.o. F with cancer noted in an endoscopically resected polyp.  After calculating the risks of residual disease and her risks of surgery, we decided to proceed with segmental resection:  The anatomy & physiology of the digestive tract was discussed.  The pathophysiology was discussed.  Natural history risks without surgery was discussed.   I worked to give an overview of the disease and the frequent need to have multispecialty involvement.  I feel the risks of no intervention will lead to serious problems that outweigh the operative risks; therefore, I recommended a partial colectomy to remove the pathology.  Laparoscopic & open techniques were discussed.   Risks such as bleeding, infection, abscess, leak, reoperation, possible ostomy, hernia, heart attack, death, and other risks were discussed.  I noted a good likelihood this will help address the problem.   Goals of post-operative recovery were discussed as well.    The patient expressed understanding & wished to proceed with surgery.  OR FINDINGS:   Patient had tattoo noted in her distal sigmoid colon  No obvious metastatic disease  on visceral parietal peritoneum or liver.  The anastomosis rests ~15 cm from the anal verge by rigid proctoscopy.  DESCRIPTION:   Informed consent was confirmed.  The patient underwent general anaesthesia without difficulty.  The patient was positioned appropriately.  VTE prevention in place.  The patient's abdomen was clipped, prepped, & draped in a sterile fashion.  Surgical timeout confirmed our plan.  The patient was positioned in reverse Trendelenburg.  Abdominal entry was gained using a Varies needle in the LUQ.  Entry was clean.  I induced carbon dioxide insufflation.  An 23mm robotic port was placed in the RUQ.  Camera inspection revealed no injury.  Extra ports were carefully placed under direct laparoscopic visualization.  I laparoscopically reflected the greater omentum and the upper abdomen the small bowel in the upper abdomen. The patient was appropriately positioned and the robot was docked to the patient's left side.  Instruments were placed under direct visualization.    I elevated the sigmoid colon.  I scored the base of peritoneum of the right side of the mesentery of the left colon from the ligament of Treitz to the peritoneal reflection of the mid rectum.  The patient had obvious tattoo noted in the distal sigmoid colon.  I elevated the sigmoid mesentery and enetered into the retro-mesenteric plane. We were able to identify the left ureter and gonadal vessels. We kept those posterior within the retroperitoneum and elevated the left colon mesentery off that. I did isolated IMA pedicle but did not ligate it yet.  I continued distally  and got into the avascular plane posterior to the mesorectum. This allowed me to help mobilize the rectum as well by freeing the mesorectum off the sacrum.  I mobilized the peritoneal coverings towards the peritoneal reflection on both the right and left sides of the rectum.  I could see the right and left ureters and stayed away from them.    I skeletonized  the inferior mesenteric artery pedicle.  I went down to its takeoff from the aorta.  After confirming the left ureter was out of the way, I went ahead and ligated the inferior mesenteric artery pedicle with bipolar robotic vessel sealer ~2cm above its takeoff from the aorta.    We ensured hemostasis. I skeletonized the mesorectum at the junction at the proximal rectum using blunt dissection & bipolar robotic vessel sealer.  I mobilized the left colon in a lateral to medial fashion off the line of Toldt up towards the splenic flexure to ensure good mobilization of the left colon to reach into the pelvis.  I divided the mesentery to the level of the sigmoid/descending junction.  We then placed a 12 mm blue load robotic stapler into the suprapubic port.  The rectosigmoid junction was transected using this stapler.  Hemostasis was good.  At this point the robotic instruments were removed.  The 12 mm suprapubic port was enlarged into a Pfannenstiel incision.  An Barclay wound protector was placed.  The colon was brought out of the wound and transected at the previously dissected margin over a pursestring device.  A 2-0 Prolene pursestring suture was placed.  This was secured with interrupted 3-0 silk sutures.  A 29 mm EEA anvil was placed into the colon and the pursestring was tied tightly around this.  This was then placed back into the abdomen.  An anastomosis was created using an EEA stapler through the rectal stump under direct laparoscopic visualization.  There was no tension on the anastomosis.  There was no leak when tested with insufflation under irrigation.  Hemostasis was good throughout the abdomen.  The irrigation was removed and the omentum was brought down over the abdominal contents.  The port and retractors were removed from the abdomen.  We switched to clean gowns, gloves, instruments and drapes.  The peritoneum of the Pfannenstiel incision was closed using a running 0 Vicryl suture.  The fascia was  closed using interrupted #1 Novafil sutures.  Subcutaneous tissue was reapproximated using a running 2-0 Vicryl suture.  The skin was closed using a running 4-0 subcuticular suture.  A sterile dressing was applied.  The remaining port sites were closed using interrupted 4-0 Vicryl sutures and Dermabond.  The patient was awakened from anesthesia and sent to the postanesthesia care unit in stable condition.  All counts were correct per operating room staff.  An MD assistant was necessary for tissue manipulation, retraction and positioning due to the complexity of the case and hospital policies

## 2021-06-04 NOTE — H&P (Signed)
The patient is a 45 year old female who presents with colorectal cancer. Patient underwent a screening colonoscopy with Dr. Ardis Hughs.  One polyp in the rectum was found as well as one polyp in the sigmoid.  This was tattooed.  Biopsy showed a small focus of invasive carcinoma approximately 2 mm from the margin.  Patient does have a family history of colon cancer in her mother.  This was also found in a polyp which was resected completely.  Surgical history consist of 4 C-sections.     Past Surgical History [name], [place]; 2021-04-04 9:22 AM) Cesarean Section - 1  Colon Polyp Removal - Colonoscopy  Tonsillectomy    Diagnostic Studies History [name], [place]; 04/04/21 9:22 AM) Colonoscopy  within last year Mammogram  within last year Pap Smear  1-5 years ago   Allergies [name], CMA; 04/04/2021 9:27 AM) Penicillin G Sodium *PENICILLINS*  Morphine Sulfate (Bulk) *ANALGESICS - OPIOID*    Social History [name], CMA; 2021/04/04 9:22 AM) Alcohol use  Occasional alcohol use. Caffeine use  Carbonated beverages, Coffee, Tea. No drug use  Tobacco use  Never smoker.   Family History [name], [place]; 2021-04-04 9:22 AM) Colon Cancer  Mother.   Pregnancy / Birth History [name], [place]; Apr 04, 2021 9:22 AM) Age at menarche  1 years. Gravida  4 Irregular periods  Length (months) of breastfeeding  7-12 Maternal age  18-30 Para  4   Other Problems [name], CMA; 2021-04-04 9:22 AM) Anxiety Disorder  Colon Cancer  Gastroesophageal Reflux Disease          Review of Systems  General Not Present- Appetite Loss, Chills, Fatigue, Fever, Night Sweats, Weight Gain and Weight Loss. Skin Not Present- Change in Wart/Mole, Dryness, Hives, Jaundice, New Lesions, Non-Healing Wounds, Rash and Ulcer. HEENT Present- Seasonal Allergies. Not Present- Earache, Hearing Loss, Hoarseness, Nose Bleed, Oral Ulcers, Ringing in the Ears, Sinus Pain, Sore Throat, Visual Disturbances, Wears glasses/contact lenses and  Yellow Eyes. Respiratory Not Present- Bloody sputum, Chronic Cough, Difficulty Breathing, Snoring and Wheezing. Breast Not Present- Breast Mass, Breast Pain, Nipple Discharge and Skin Changes. Cardiovascular Not Present- Chest Pain, Difficulty Breathing Lying Down, Leg Cramps, Palpitations, Rapid Heart Rate, Shortness of Breath and Swelling of Extremities. Gastrointestinal Not Present- Abdominal Pain, Bloating, Bloody Stool, Change in Bowel Habits, Chronic diarrhea, Constipation, Difficulty Swallowing, Excessive gas, Gets full quickly at meals, Hemorrhoids, Indigestion, Nausea, Rectal Pain and Vomiting. Female Genitourinary Not Present- Frequency, Nocturia, Painful Urination, Pelvic Pain and Urgency. Musculoskeletal Not Present- Back Pain, Joint Pain, Joint Stiffness, Muscle Pain, Muscle Weakness and Swelling of Extremities. Neurological Not Present- Decreased Memory, Fainting, Headaches, Numbness, Seizures, Tingling, Tremor, Trouble walking and Weakness. Psychiatric Present- Anxiety. Not Present- Bipolar, Change in Sleep Pattern, Depression, Fearful and Frequent crying. Endocrine Not Present- Cold Intolerance, Excessive Hunger, Hair Changes, Heat Intolerance, Hot flashes and New Diabetes. Hematology Not Present- Blood Thinners, Easy Bruising, Excessive bleeding, Gland problems, HIV and Persistent Infections. BP (!) 147/99   Pulse 93   Temp 98.4 F (36.9 C) (Oral)   Resp 15   Ht 5\' 5"  (1.651 m)   Wt 83.9 kg   SpO2 100%   BMI 30.79 kg/m         Physical Exam    General Mental Status - Alert. General Appearance - Cooperative.  CV: RRR Lungs: CTA Abdomen Palpation/Percussion Palpation and Percussion of the abdomen reveal - Soft and Non Tender.       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 Was a polyp noted in her sigmoid colon. This was resected with approximately 2 mm margin. It was  semi-pedunculated, with no poor differentiation tumor abutting or lymphovascular invasion. Upon placing this in a risk calculated her she is at high risk with approximately 10-15% chance of residual cancer being in the colon or lymph nodes itself. I have discussed this with her and she has decided to proceed with resection of the sigmoid colon. We will plan on doing a robotic approach. The surgery and anatomy were described to the patient as well as the risks of surgery and the possible complications. These include: Bleeding, deep abdominal infections and possible wound complications such as hernia and infection, damage to adjacent structures, leak of surgical connections, which can lead to other surgeries and possibly an ostomy, possible need for other procedures, such as abscess drains in radiology, possible prolonged hospital stay, possible diarrhea from removal of part of the colon, possible constipation from narcotics, possible bowel, bladder or sexual dysfunction if having rectal surgery, prolonged fatigue/weakness or appetite loss, possible early recurrence of of disease, possible complications of their medical problems such as heart disease or arrhythmias or lung problems, death (less than 1%). I believe the patient understands and wishes to proceed with the surgery.

## 2021-06-05 ENCOUNTER — Other Ambulatory Visit: Payer: Self-pay

## 2021-06-05 LAB — CBC
HCT: 32 % — ABNORMAL LOW (ref 36.0–46.0)
Hemoglobin: 10.8 g/dL — ABNORMAL LOW (ref 12.0–15.0)
MCH: 31.3 pg (ref 26.0–34.0)
MCHC: 33.8 g/dL (ref 30.0–36.0)
MCV: 92.8 fL (ref 80.0–100.0)
Platelets: 214 10*3/uL (ref 150–400)
RBC: 3.45 MIL/uL — ABNORMAL LOW (ref 3.87–5.11)
RDW: 12.3 % (ref 11.5–15.5)
WBC: 12 10*3/uL — ABNORMAL HIGH (ref 4.0–10.5)
nRBC: 0 % (ref 0.0–0.2)

## 2021-06-05 LAB — BASIC METABOLIC PANEL
Anion gap: 4 — ABNORMAL LOW (ref 5–15)
BUN: 7 mg/dL (ref 6–20)
CO2: 25 mmol/L (ref 22–32)
Calcium: 8.3 mg/dL — ABNORMAL LOW (ref 8.9–10.3)
Chloride: 106 mmol/L (ref 98–111)
Creatinine, Ser: 0.65 mg/dL (ref 0.44–1.00)
GFR, Estimated: >60 mL/min (ref >60)
Glucose, Bld: 130 mg/dL — ABNORMAL HIGH (ref 70–99)
Potassium: 3.7 mmol/L (ref 3.5–5.1)
Sodium: 135 mmol/L (ref 135–145)

## 2021-06-05 LAB — SURGICAL PATHOLOGY

## 2021-06-05 MED ORDER — TRAMADOL HCL 50 MG PO TABS
50.0000 mg | ORAL_TABLET | Freq: Four times a day (QID) | ORAL | Status: DC | PRN
  Administered 2021-06-05 (×2): 50 mg via ORAL
  Filled 2021-06-05 (×2): qty 1

## 2021-06-05 MED ORDER — ACETAMINOPHEN 500 MG PO TABS
1000.0000 mg | ORAL_TABLET | Freq: Four times a day (QID) | ORAL | Status: AC
  Administered 2021-06-05 (×2): 1000 mg via ORAL
  Filled 2021-06-05 (×2): qty 2

## 2021-06-05 NOTE — Progress Notes (Signed)
1 Day Post-Op Robotic Sigmoidectomy Subjective: Had some post op nausea yesterday.  Feels better this AM.  No flatus  Objective: Vital signs in last 24 hours: Temp:  [97.3 F (36.3 C)-98.7 F (37.1 C)] 98.4 F (36.9 C) (06/30 0321) Pulse Rate:  [82-115] 82 (06/30 0611) Resp:  [15-20] 18 (06/30 0611) BP: (99-147)/(56-99) 99/60 (06/30 0611) SpO2:  [91 %-100 %] 99 % (06/30 0611)   Intake/Output from previous day: 06/29 0701 - 06/30 0700 In: 2578.5 [P.O.:720; I.V.:1700; IV Piggyback:158.5] Out: 510 [Urine:500; Blood:10] Intake/Output this shift: No intake/output data recorded.   General appearance: alert and cooperative GI: soft, non-distended  Incision: no significant drainage  Lab Results:  Recent Labs    06/05/21 0410  WBC 12.0*  HGB 10.8*  HCT 32.0*  PLT 214   BMET Recent Labs    06/05/21 0410  NA 135  K 3.7  CL 106  CO2 25  GLUCOSE 130*  BUN 7  CREATININE 0.65  CALCIUM 8.3*   PT/INR No results for input(s): LABPROT, INR in the last 72 hours. ABG No results for input(s): PHART, HCO3 in the last 72 hours.  Invalid input(s): PCO2, PO2  MEDS, Scheduled  alvimopan  12 mg Oral BID   enoxaparin (LOVENOX) injection  40 mg Subcutaneous Q24H   feeding supplement  237 mL Oral BID BM   gabapentin  300 mg Oral BID   saccharomyces boulardii  250 mg Oral BID   venlafaxine XR  150 mg Oral Daily    Studies/Results: No results found.  Assessment: s/p Procedure(s): XI ROBOT ASSISTED LAPAROSCOPIC PARTIAL COLECTOMY Patient Active Problem List   Diagnosis Date Noted   Cancer of sigmoid colon ([place]) 06/04/2021   Postpartum care following cesarean delivery (08/24/13) 08/24/2013   Status post C-section 08/24/2013    Expected post op course  Plan: Advance diet [place] lock IVFs   LOS: 1 day     .Rosario Adie, MD [facility], [place]    06/05/2021 7:07 AM

## 2021-06-06 LAB — CBC
HCT: 33.9 % — ABNORMAL LOW (ref 36.0–46.0)
Hemoglobin: 10.9 g/dL — ABNORMAL LOW (ref 12.0–15.0)
MCH: 30.2 pg (ref 26.0–34.0)
MCHC: 32.2 g/dL (ref 30.0–36.0)
MCV: 93.9 fL (ref 80.0–100.0)
Platelets: 220 10*3/uL (ref 150–400)
RBC: 3.61 MIL/uL — ABNORMAL LOW (ref 3.87–5.11)
RDW: 12.4 % (ref 11.5–15.5)
WBC: 7.7 10*3/uL (ref 4.0–10.5)
nRBC: 0 % (ref 0.0–0.2)

## 2021-06-06 LAB — BASIC METABOLIC PANEL
Anion gap: 6 (ref 5–15)
BUN: 10 mg/dL (ref 6–20)
CO2: 27 mmol/L (ref 22–32)
Calcium: 8.4 mg/dL — ABNORMAL LOW (ref 8.9–10.3)
Chloride: 106 mmol/L (ref 98–111)
Creatinine, Ser: 0.7 mg/dL (ref 0.44–1.00)
GFR, Estimated: >60 mL/min (ref >60)
Glucose, Bld: 93 mg/dL (ref 70–99)
Potassium: 3.9 mmol/L (ref 3.5–5.1)
Sodium: 139 mmol/L (ref 135–145)

## 2021-06-06 MED ORDER — TRAMADOL HCL 50 MG PO TABS: 50.0000 mg | ORAL_TABLET | Freq: Four times a day (QID) | ORAL | 0 refills | Status: DC | PRN

## 2021-06-06 NOTE — Discharge Summary (Signed)
Physician Discharge Summary  Patient ID: Sydney Goodwin MRN: [id number] DOB/AGE: 1976-10-29 45 y.o.  Admit date: 06/04/2021 Discharge date: 06/06/2021  Admission Diagnoses: Colon cancer  Discharge Diagnoses:  Active Problems:   Cancer of sigmoid colon ([place]) Stage 1  Discharged Condition: good  Hospital Course:   Consults: None  Significant Diagnostic Studies: labs: cbc, bmet  Treatments: IV hydration, analgesia: acetaminophen, and surgery: robotic sigmoidectomy  Discharge Exam: Blood pressure 122/85, pulse 95, temperature 98.7 F (37.1 C), temperature source Oral, resp. rate 16, height 5\' 5"  (1.651 m), weight 82.7 kg, SpO2 99 %, unknown if currently breastfeeding. General appearance: alert and cooperative GI: soft, nondistended Inc clean, dry, intac   Disposition:   Patient admitted to the floor after surgery.  She was placed on clear liquids.  Her Foley was removed the evening of postop day 0.  She had some postoperative nausea and vomiting.  The following morning this resolved and she was able to resume her diet.  She was ambulating without difficulty and taking minimal narcotic pain medications.  By postop day 2 she was felt to be in stable condition for discharge to home.   Allergies as of 06/06/2021       Reactions   Morphine And Related Itching, Other (See Comments)   Itching from past surgeries.   Hydromorphone Other (See Comments)   Itching from past surgeries.   Penicillins Itching, Rash, Other (See Comments)   Has patient had a PCN reaction causing immediate rash, facial/tongue/throat swelling, SOB or lightheadedness with hypotension: Yes Has patient had a PCN reaction causing severe rash involving mucus membranes or skin necrosis: No Has patient had a PCN reaction that required hospitalization No Has patient had a PCN reaction occurring within the last 10 years: No If all of the above answers are "NO", then may proceed with Cephalosporin use.         Medication List     TAKE these medications    calcium carbonate 750 MG chewable tablet Commonly known as: TUMS EX Chew 2 tablets by mouth daily as needed for heartburn.   diphenhydrAMINE 25 MG tablet Commonly known as: BENADRYL Take 50 mg by mouth at bedtime as needed for itching or sleep.   ibuprofen 200 MG tablet Commonly known as: ADVIL Take 400 mg by mouth every 8 (eight) hours as needed for headache or mild pain.   metroNIDAZOLE 500 MG tablet Commonly known as: FLAGYL Take 1,000 mg by mouth in the morning, at noon, and at bedtime.   traMADol 50 MG tablet Commonly known as: ULTRAM Take 1 tablet (50 mg total) by mouth every 6 (six) hours as needed for moderate pain.   venlafaxine XR 150 MG 24 hr capsule Commonly known as: EFFEXOR-XR Take 150 mg by mouth daily.        Follow-up Information     [name], MD. Schedule an appointment as soon as possible for a visit in 2 week(s).   Specialties: General Surgery, Colon and Rectal Surgery Contact information: [address] [place] [address] [phone]                 Signed: Rosario Adie 08/11/8015, 7:37 AM

## 2021-06-06 NOTE — Progress Notes (Signed)
Pt alert and oriented, tolerating diet. D/C instructions given, pt d/cd to home. 

## 2021-06-06 NOTE — Discharge Instructions (Signed)
SURGERY: POST OP INSTRUCTIONS (Surgery for small bowel obstruction, colon resection, etc)   ######################################################################  EAT Gradually transition to a high fiber diet with a fiber supplement over the next few days after discharge  WALK Walk an hour a day.  Control your pain to do that.    CONTROL PAIN Control pain so that you can walk, sleep, tolerate sneezing/coughing, go up/down stairs.  HAVE A BOWEL MOVEMENT DAILY Keep your bowels regular to avoid problems.  OK to try a laxative to override constipation.  OK to use an antidairrheal to slow down diarrhea.  Call if not better after 2 tries  CALL IF YOU HAVE PROBLEMS/CONCERNS Call if you are still struggling despite following these instructions. Call if you have concerns not answered by these instructions  ######################################################################   DIET Follow a light diet the first few days at home.  Start with a bland diet such as soups, liquids, starchy foods, low fat foods, etc.  If you feel full, bloated, or constipated, stay on a ful liquid or pureed/blenderized diet for a few days until you feel better and no longer constipated. Be sure to drink plenty of fluids every day to avoid getting dehydrated (feeling dizzy, not urinating, etc.). Gradually add a fiber supplement to your diet over the next week.  Gradually get back to a regular solid diet.  Avoid fast food or heavy meals the first week as you are more likely to get nauseated. It is expected for your digestive tract to need a few months to get back to normal.  It is common for your bowel movements and stools to be irregular.  You will have occasional bloating and cramping that should eventually fade away.  Until you are eating solid food normally, off all pain medications, and back to regular activities; your bowels will not be normal. Focus on eating a low-fat, high fiber diet the rest of your life  (See Getting to Good Bowel Health, below).  CARE of your INCISION or WOUND  It is good for closed incisions and even open wounds to be washed every day.  Shower every day.  Short baths are fine.  Wash the incisions and wounds clean with soap & water.    You may leave closed incisions open to air if it is dry.   You may cover the incision with clean gauze & replace it after your daily shower for comfort.  STAPLES: You have skin staples.  Leave them in place & set up an appointment for them to be removed by a surgery office nurse ~10 days after surgery. = 1st week of January 2024    ACTIVITIES as tolerated Start light daily activities --- self-care, walking, climbing stairs-- beginning the day after surgery.  Gradually increase activities as tolerated.  Control your pain to be active.  Stop when you are tired.  Ideally, walk several times a day, eventually an hour a day.   Most people are back to most day-to-day activities in a few weeks.  It takes 4-8 weeks to get back to unrestricted, intense activity. If you can walk 30 minutes without difficulty, it is safe to try more intense activity such as jogging, treadmill, bicycling, low-impact aerobics, swimming, etc. Save the most intensive and strenuous activity for last (Usually 4-8 weeks after surgery) such as sit-ups, heavy lifting, contact sports, etc.  Refrain from any intense heavy lifting or straining until you are off narcotics for pain control.  You will have off days, but things should improve   week-by-week. DO NOT PUSH THROUGH PAIN.  Let pain be your guide: If it hurts to do something, don't do it.  Pain is your body warning you to avoid that activity for another week until the pain goes down. You may drive when you are no longer taking narcotic prescription pain medication, you can comfortably wear a seatbelt, and you can safely make sudden turns/stops to protect yourself without hesitating due to pain. You may have sexual intercourse when it  is comfortable. If it hurts to do something, stop.  MEDICATIONS Take your usually prescribed home medications unless otherwise directed.   Blood thinners:  Usually you can restart any strong blood thinners after the second postoperative day.  It is OK to take aspirin right away.     If you are on strong blood thinners (warfarin/Coumadin, Plavix, Xerelto, Eliquis, Pradaxa, etc), discuss with your surgeon, medicine PCP, and/or cardiologist for instructions on when to restart the blood thinner & if blood monitoring is needed (PT/INR blood check, etc).     PAIN CONTROL Pain after surgery or related to activity is often due to strain/injury to muscle, tendon, nerves and/or incisions.  This pain is usually short-term and will improve in a few months.  To help speed the process of healing and to get back to regular activity more quickly, DO THE FOLLOWING THINGS TOGETHER: Increase activity gradually.  DO NOT PUSH THROUGH PAIN Use Ice and/or Heat Try Gentle Massage and/or Stretching Take over the counter pain medication Take Narcotic prescription pain medication for more severe pain  Good pain control = faster recovery.  It is better to take more medicine to be more active than to stay in bed all day to avoid medications.  Increase activity gradually Avoid heavy lifting at first, then increase to lifting as tolerated over the next 6 weeks. Do not "push through" the pain.  Listen to your body and avoid positions and maneuvers than reproduce the pain.  Wait a few days before trying something more intense Walking an hour a day is encouraged to help your body recover faster and more safely.  Start slowly and stop when getting sore.  If you can walk 30 minutes without stopping or pain, you can try more intense activity (running, jogging, aerobics, cycling, swimming, treadmill, sex, sports, weightlifting, etc.) Remember: If it hurts to do it, then don't do it! Use Ice and/or Heat You will have swelling and  bruising around the incisions.  This will take several weeks to resolve. Ice packs or heating pads (6-8 times a day, 30-60 minutes at a time) will help sooth soreness & bruising. Some people prefer to use ice alone, heat alone, or alternate between ice & heat.  Experiment and see what works best for you.  Consider trying ice for the first few days to help decrease swelling and bruising; then, switch to heat to help relax sore spots and speed recovery. Shower every day.  Short baths are fine.  It feels good!  Keep the incisions and wounds clean with soap & water.   Try Gentle Massage and/or Stretching Massage at the area of pain many times a day Stop if you feel pain - do not overdo it Take over the counter pain medication This helps the muscle and nerve tissues become less irritable and calm down faster Choose ONE of the following over-the-counter anti-inflammatory medications: Acetaminophen 500mg tabs (Tylenol) 1-2 pills with every meal and just before bedtime (avoid if you have liver problems or if you have   acetaminophen in you narcotic prescription) Naproxen 220mg tabs (ex. Aleve, Naprosyn) 1-2 pills twice a day (avoid if you have kidney, stomach, IBD, or bleeding problems) Ibuprofen 200mg tabs (ex. Advil, Motrin) 3-4 pills with every meal and just before bedtime (avoid if you have kidney, stomach, IBD, or bleeding problems) Take with food/snack several times a day as directed for at least 2 weeks to help keep pain / soreness down & more manageable. Take Narcotic prescription pain medication for more severe pain A prescription for strong pain control is often given to you upon discharge (for example: oxycodone/Percocet, hydrocodone/Norco/Vicodin, or tramadol/Ultram) Take your pain medication as prescribed. Be mindful that most narcotic prescriptions contain Tylenol (acetaminophen) as well - avoid taking too much Tylenol. If you are having problems/concerns with the prescription medicine (does  not control pain, nausea, vomiting, rash, itching, etc.), please call us [phone] to see if we need to switch you to a different pain medicine that will work better for you and/or control your side effects better. If you need a refill on your pain medication, you must call the office before 4 pm and on weekdays only.  By federal law, prescriptions for narcotics cannot be called into a pharmacy.  They must be filled out on paper & picked up from our office by the patient or authorized caretaker.  Prescriptions cannot be filled after 4 pm nor on weekends.    WHEN TO CALL US [phone] Severe uncontrolled or worsening pain  Fever over 101 F (38.5 C) Concerns with the incision: Worsening pain, redness, rash/hives, swelling, bleeding, or drainage Reactions / problems with new medications (itching, rash, hives, nausea, etc.) Nausea and/or vomiting Difficulty urinating Difficulty breathing Worsening fatigue, dizziness, lightheadedness, blurred vision Other concerns If you are not getting better after two weeks or are noticing you are getting worse, contact our office [phone] for further advice.  We may need to adjust your medications, re-evaluate you in the office, send you to the emergency room, or see what other things we can do to help. The clinic staff is available to answer your questions during regular business hours (8:30am-5pm).  Please don't hesitate to call and ask to speak to one of our nurses for clinical concerns.    A surgeon from [facility] is always on call at the hospitals 24 hours/day If you have a medical emergency, go to the nearest emergency room or call 911.  FOLLOW UP in our office One the day of your discharge from the hospital (or the next business weekday), please call [facility] to set up or confirm an appointment to see your surgeon in the office for a follow-up appointment.  Usually it is 2-3 weeks after your surgery.   If you  have skin staples at your incision(s), let the office know so we can set up a time in the office for the nurse to remove them (usually around 10 days after surgery). Make sure that you call for appointments the day of discharge (or the next business weekday) from the hospital to ensure a convenient appointment time. IF YOU HAVE DISABILITY OR FAMILY LEAVE FORMS, BRING THEM TO THE OFFICE FOR PROCESSING.  DO NOT GIVE THEM TO YOUR DOCTOR.  [facility] [address], [address], [place], [place]  [address] ? [phone] - Main [phone] - Toll Free,  [phone] - Fax [email or web address]    GETTING TO GOOD BOWEL HEALTH. It is expected for your digestive tract to   need a few months to get back to normal.  It is common for your bowel movements and stools to be irregular.  You will have occasional bloating and cramping that should eventually fade away.  Until you are eating solid food normally, off all pain medications, and back to regular activities; your bowels will not be normal.   Avoiding constipation The goal: ONE SOFT BOWEL MOVEMENT A DAY!    Drink plenty of fluids.  Choose water first. TAKE A FIBER SUPPLEMENT EVERY DAY THE REST OF YOUR LIFE During your first week back home, gradually add back a fiber supplement every day Experiment which form you can tolerate.   There are many forms such as powders, tablets, wafers, gummies, etc Psyllium bran (Metamucil), methylcellulose (Citrucel), Miralax or Glycolax, Benefiber, Flax Seed.  Adjust the dose week-by-week (1/2 dose/day to 6 doses a day) until you are moving your bowels 1-2 times a day.  Cut back the dose or try a different fiber product if it is giving you problems such as diarrhea or bloating. Sometimes a laxative is needed to help jump-start bowels if constipated until the fiber supplement can help regulate your bowels.  If you are tolerating eating & you are farting, it is okay to try a gentle  laxative such as double dose MiraLax, prune juice, or Milk of Magnesia.  Avoid using laxatives too often. Stool softeners can sometimes help counteract the constipating effects of narcotic pain medicines.  It can also cause diarrhea, so avoid using for too long. If you are still constipated despite taking fiber daily, eating solids, and a few doses of laxatives, call our office. Controlling diarrhea Try drinking liquids and eating bland foods for a few days to avoid stressing your intestines further. Avoid dairy products (especially milk & ice cream) for a short time.  The intestines often can lose the ability to digest lactose when stressed. Avoid foods that cause gassiness or bloating.  Typical foods include beans and other legumes, cabbage, broccoli, and dairy foods.  Avoid greasy, spicy, fast foods.  Every person has some sensitivity to other foods, so listen to your body and avoid those foods that trigger problems for you. Probiotics (such as active yogurt, Align, etc) may help repopulate the intestines and colon with normal bacteria and calm down a sensitive digestive tract Adding a fiber supplement gradually can help thicken stools by absorbing excess fluid and retrain the intestines to act more normally.  Slowly increase the dose over a few weeks.  Too much fiber too soon can backfire and cause cramping & bloating. It is okay to try and slow down diarrhea with a few doses of antidiarrheal medicines.   Bismuth subsalicylate (ex. Kayopectate, Pepto Bismol) for a few doses can help control diarrhea.  Avoid if pregnant.   Loperamide (Imodium) can slow down diarrhea.  Start with one tablet (2mg) first.  Avoid if you are having fevers or severe pain.  ILEOSTOMY PATIENTS WILL HAVE CHRONIC DIARRHEA since their colon is not in use.    Drink plenty of liquids.  You will need to drink even more glasses of water/liquid a day to avoid getting dehydrated. Record output from your ileostomy.  Expect to empty  the bag every 3-4 hours at first.  Most people with a permanent ileostomy empty their bag 4-6 times at the least.   Use antidiarrheal medicine (especially Imodium) several times a day to avoid getting dehydrated.  Start with a dose at bedtime & breakfast.  Adjust up or   down as needed.  Increase antidiarrheal medications as directed to avoid emptying the bag more than 8 times a day (every 3 hours). Work with your wound ostomy nurse to learn care for your ostomy.  See ostomy care instructions. TROUBLESHOOTING IRREGULAR BOWELS 1) Start with a soft & bland diet. No spicy, greasy, or fried foods.  2) Avoid gluten/wheat or dairy products from diet to see if symptoms improve. 3) Miralax 17gm or flax seed mixed in 8oz. water or juice-daily. May use 2-4 times a day as needed. 4) Gas-X, Phazyme, etc. as needed for gas & bloating.  5) Prilosec (omeprazole) over-the-counter as needed 6)  Consider probiotics (Align, Activa, etc) to help calm the bowels down  Call your doctor if you are getting worse or not getting better.  Sometimes further testing (cultures, endoscopy, X-ray studies, CT scans, bloodwork, etc.) may be needed to help diagnose and treat the cause of the diarrhea. [facility] [address], [address], [place], [place]  [address] [phone] - Main.    [phone]  - Toll Free.   [phone] - Fax [email or web address]   ###############################   #######################################################  Ostomy Support Information  You've heard that people get along just fine with only one of their eyes, or one of their lungs, or one of their kidneys. But you also know that you have only one intestine and only one bladder, and that leaves you feeling awfully empty, both physically and emotionally: You think no other people go around without part of their intestine with the ends of their intestines sticking out through their abdominal walls.    YOU ARE NOT ALONE.  There are nearly three quarters of a million people in the [place] who have an ostomy; people who have had surgery to remove all or part of their colons or bladders.   There is even a national association, the [employer] with over 350 local affiliated support groups that are organized by volunteers who provide peer support and counseling. [employer] has a toll free telephone num-ber, 800-826-0826 and an educational, interactive website, www.ostomy.org   An ostomy is an opening in the belly (abdominal wall) made by surgery. Ostomates are people who have had this procedure. The opening (stoma) allows the kidney or bowel to grdischarge waste. An external pouch covers the stoma to collect waste. Pouches are are a simple bag and are odor free. Different companies have disposable or reusable pouches to fit one's lifestyle. An ostomy can either be temporary or permanent.   THERE ARE THREE MAIN TYPES OF OSTOMIES Colostomy. A colostomy is a surgically created opening in the large intestine (colon). Ileostomy. An ileostomy is a surgically created opening in the small intestine. Urostomy. A urostomy is a surgically created opening to divert urine away from the bladder.  OSTOMY Care  The following guidelines will make care of your colostomy easier. Keep this information close by for quick reference.  Helpful DIET hints Eat a well-balanced diet including vegetables and fresh fruits. Eat on a regular schedule.  Drink at least 6 to 8 glasses of fluids daily. Eat slowly in a relaxed atmosphere. Chew your food thoroughly. Avoid chewing gum, smoking, and drinking from a straw. This will help decrease the amount of air you swallow, which may help reduce gas. Eating yogurt or drinking buttermilk may help reduce gas.  To control gas at night, do not eat after 8 p.m. This will give your bowel time to quiet down before you go   to bed.  If gas is a problem, you can purchase  Beano. Sprinkle Beano on the first bite of food before eating to reduce gas. It has no flavor and should not change the taste of your food. You can buy Beano over the counter at your local drugstore.  Foods like fish, onions, garlic, broccoli, asparagus, and cabbage produce odor. Although your pouch is odor-proof, if you eat these foods you may notice a stronger odor when emptying your pouch. If this is a concern, you may want to limit these foods in your diet.  If you have an ileostomy, you will have chronic diarrhea & need to drink more liquids to avoid getting dehydrated.  Consider antidiarrheal medicine like imodium (loperamide) or Lomotil to help slow down bowel movements / diarrhea into your ileostomy bag.  GETTING TO GOOD BOWEL HEALTH WITH AN ILEOSTOMY    With the colon bypassed & not in use, you will have small bowel diarrhea.   It is important to thicken & slow your bowel movements down.   The goal: 4-6 small BOWEL MOVEMENTS A DAY It is important to drink plenty of liquids to avoid getting dehydrated  CONTROLLING ILEOSTOMY DIARRHEA  TAKE A FIBER SUPPLEMENT (FiberCon or Benefiner soluble fiber) twice a day - to thicken stools by absorbing excess fluid and retrain the intestines to act more normally.  Slowly increase the dose over a few weeks.  Too much fiber too soon can backfire and cause cramping & bloating.  TAKE AN IRON SUPPLEMENT twice a day to naturally constipate your bowels.  Usually ferrous sulfate 325mg twice a day)  TAKE ANTI-DIARRHEAL MEDICINES: Loperamide (Imodium) can slow down diarrhea.  Start with two tablets (= 4mg) first and then try one tablet every 6 hours.  Can go up to 2 pills four times day (8 pills of 2mg max) Avoid if you are having fevers or severe pain.  If you are not better or start feeling worse, stop all medicines and call your doctor for advice LoMotil (Diphenoxylate / Atropine) is another medicine that can constipate & slow down bowel moevements Pepto  Bismol (bismuth) can gently thicken bowels as well  If diarrhea is worse,: drink plenty of liquids and try simpler foods for a few days to avoid stressing your intestines further. Avoid dairy products (especially milk & ice cream) for a short time.  The intestines often can lose the ability to digest lactose when stressed. Avoid foods that cause gassiness or bloating.  Typical foods include beans and other legumes, cabbage, broccoli, and dairy foods.  Every person has some sensitivity to other foods, so listen to our body and avoid those foods that trigger problems for you.Call your doctor if you are getting worse or not better.  Sometimes further testing (cultures, endoscopy, X-ray studies, bloodwork, etc) may be needed to help diagnose and treat the cause of the diarrhea. Take extra anti-diarrheal medicines (maximum is 8 pills of 2mg loperamide a day)   Tips for POUCHING an OSTOMY   Changing Your Pouch The best time to change your pouch is in the morning, before eating or drinking anything. Your stoma can function at any time, but it will function more after eating or drinking.   Applying the pouching system  Place all your equipment close at hand before removing your pouch.  Wash your hands.  Stand or sit in front of a mirror. Use the position that works best for you. Remember that you must keep the skin around the stoma   wrinkle-free for a good seal.  Gently remove the used pouch (1-piece system) or the pouch and old wafer (2-piece system). Empty the pouch into the toilet. Save the closure clip to use again.  Wash the stoma itself and the skin around the stoma. Your stoma may bleed a little when being washed. This is normal. Rinse and pat dry. You may use a wash cloth or soft paper towels (like Bounty), mild soap (like Dial, Safeguard, or Ivory), and water. Avoid soaps that contain perfumes or lotions.  For a new pouch (1-piece system) or a new wafer (2-piece system), measure your  stoma using the stoma guide in each box of supplies.  Trace the shape of your stoma onto the back of the new pouch or the back of the new wafer. Cut out the opening. Remove the paper backing and set it aside.  Optional: Apply a skin barrier powder to surrounding skin if it is irritated (bare or weeping), and dust off the excess. Optional: Apply a skin-prep wipe (such as Skin Prep or All-Kare) to the skin around the stoma, and let it dry. Do not apply this solution if the skin is irritated (red, tender, or broken) or if you have shaved around the stoma. Optional: Apply a skin barrier paste (such as Stomahesive, Coloplast, or Premium) around the opening cut in the back of the pouch or wafer. Allow it to dry for 30 to 60 seconds.  Hold the pouch (1-piece system) or wafer (2-piece system) with the sticky side toward your body. Make sure the skin around the stoma is wrinkle-free. Center the opening on the stoma, then press firmly to your abdomen (Fig. 4). Look in the mirror to check if you are placing the pouch, or wafer, in the right position. For a 2-piece system, snap the pouch onto the wafer. Make sure it snaps into place securely.  Place your hand over the stoma and the pouch or wafer for about 30 seconds. The heat from your hand can help the pouch or wafer stick to your skin.  Add deodorant (such as Super Banish or Nullo) to your pouch. Other options include food extracts such as vanilla oil and peppermint extract. Add about 10 drops of the deodorant to the pouch. Then apply the closure clamp. Note: Do not use toxic  chemicals or commercial cleaning agents in your pouch. These substances may harm the stoma.  Optional: For extra seal, apply tape to all 4 sides around the pouch or wafer, as if you were framing a picture. You may use any brand of medical adhesive tape. Change your pouch every 5 to 7 days. Change it immediately if a leak occurs.  Wash your hands afterwards.  If you are wearing a  2-piece system, you may use 2 new pouches per week and alternate them. Rinse the pouch with mild soap and warm water and hang it to dry for the next day. Apply the fresh pouch. Alternate the 2 pouches like this for a week. After a week, change the wafer and begin with 2 new pouches. Place the old pouches in a plastic bag, and put them in the trash.   LIVING WITH AN OSTOMY  Emptying Your Pouch Empty your pouch when it is one-third full (of urine, stool, and/or gas). If you wait until your pouch is fuller than this, it will be more difficult to empty and more noticeable. When you empty your pouch, either put toilet paper in the toilet bowl first, or flush the   toilet while you empty the pouch. This will reduce splashing. You can empty the pouch between your legs or to one side while sitting, or while standing or stooping. If you have a 2-piece system, you can snap off the pouch to empty it. Remember that your stoma may function during this time. If you wish to rinse your pouch after you empty it, a turkey baster can be helpful. When using a baster, squirt water up into the pouch through the opening at the bottom. With a 2-piece system, you can snap off the pouch to rinse it. After rinsing  your pouch, empty it into the toilet. When rinsing your pouch at home, put a few granules of Dreft soap in the rinse water. This helps lubricate and freshen your pouch. The inside of your pouch can be sprayed with non-stick cooking oil (Pam spray). This may help reduce stool sticking to the inside of the pouch.  Bathing You may shower or bathe with your pouch on or off. Remember that your stoma may function during this time.  The materials you use to wash your stoma and the skin around it should be clean, but they do not need to be sterile.  Wearing Your Pouch During hot weather, or if you perspire a lot in general, wear a cover over your pouch. This may prevent a rash on your skin under the pouch. Pouch covers are  sold at ostomy supply stores. Wear the pouch inside your underwear for better support. Watch your weight. Any gain or loss of 10 to 15 pounds or more can change the way your pouch fits.  Going Away From Home A collapsible cup (like those that come in travel kits) or a soft plastic squirt bottle with a pull-up top (like a travel bottle for shampoo) can be used for rinsing your pouch when you are away from home. Tilt the opening of the pouch at an upward angle when using a cup to rinse.  Carry wet wipes or extra tissues to use in public bathrooms.  Carry an extra pouching system with you at all times.  Never keep ostomy supplies in the glove compartment of your car. Extreme heat or cold can damage the skin barriers and adhesive wafers on the pouch.  When you travel, carry your ostomy supplies with you at all times. Keep them within easy reach. Do not pack ostomy supplies in baggage that will be checked or otherwise separated from you, because your baggage might be lost. If you're traveling out of the country, it is helpful to have a letter stating that you are carrying ostomy supplies as a medical necessity.  If you need ostomy supplies while traveling, look in the yellow pages of the telephone book under "Surgical Supplies." Or call the local ostomy organization to find out where supplies are available.  Do not let your ostomy supplies get low. Always order new pouches before you use the last one.  Reducing Odor Limit foods such as broccoli, cabbage, onions, fish, and garlic in your diet to help reduce odor. Each time you empty your pouch, carefully clean the opening of the pouch, both inside and outside, with toilet paper. Rinse your pouch 1 or 2 times daily after you empty it (see directions for emptying your pouch and going away from home). Add deodorant (such as Super Banish or Nullo) to your pouch. Use air deodorizers in your bathroom. Do not add aspirin to your pouch. Even though  aspirin can help prevent odor, it   could cause ulcers on your stoma.  When to call the doctor Call the doctor if you have any of the following symptoms: Purple, black, or white stoma Severe cramps lasting more than 6 hours Severe watery discharge from the stoma lasting more than 6 hours No output from the colostomy for 3 days Excessive bleeding from your stoma Swelling of your stoma to more than 1/2-inch larger than usual Pulling inward of your stoma below skin level Severe skin irritation or deep ulcers Bulging or other changes in your abdomen  When to call your ostomy nurse Call your ostomy/enterostomal therapy (WOCN) nurse if any of the following occurs: Frequent leaking of your pouching system Change in size or appearance of your stoma, causing discomfort or problems with your pouch Skin rash or rawness Weight gain or loss that causes problems with your pouch     FREQUENTLY ASKED QUESTIONS   Why haven't you met any of these folks who have an ostomy?  Well, maybe you have! You just did not recognize them because an ostomy doesn't show. It can be kept secret if you wish. Why, maybe some of your best friends, office associates or neighbors have an ostomy ... you never can tell. People facing ostomy surgery have many quality-of-life questions like: Will you bulge? Smell? Make noises? Will you feel waste leaving your body? Will you be a captive of the toilet? Will you starve? Be a social outcast? Get/stay married? Have babies? Easily bathe, go swimming, bend over?  OK, let's look at what you can expect:   Will you bulge?  Remember, without part of the intestine or bladder, and its contents, you should have a flatter tummy than before. You can expect to wear, with little exception, what you wore before surgery ... and this in-cludes tight clothing and bathing suits.   Will you smell?  Today, thanks to modern odor proof pouching systems, you can walk into an ostomy support group  meeting and not smell anything that is foul or offensive. And, for those with an ileostomy or colostomy who are concerned about odor when emptying their pouch, there are in-pouch deodorants that can be used to eliminate any waste odors that may exist.   Will you make noises?  Everyone produces gas, especially if they are an air-swallower. But intestinal sounds that occur from time to time are no differ-ent than a gurgling tummy, and quite often your clothing will muffle any sounds.   Will you feel the waste discharges?  For those with a colostomy or ileostomy there might be a slight pressure when waste leaves your body, but understand that the intestines have no nerve endings, so there will be no unpleasant sensations. Those with a urostomy will probably be unaware of any kidney drainage.   Will you be a captive of the toilet?  Immediately post-op you will spend more time in the bathroom than you will after your body recovers from surgery. Every person is different, but on average those with an ileostomy or urostomy may empty their pouches 4 to 6 times a day; a little  less if you have a colostomy. The average wear time between pouch system changes is 3 to 5 days and the changing process should take less than 30 minutes.   Will I need to be on a special diet? Most people return to their normal diet when they have recovered from surgery. Be sure to chew your food well, eat a well-balanced diet and drink plenty of fluids. If   you experience problems with a certain food, wait a couple of weeks and try it again.  Will there be odor and noises? Pouching systems are designed to be odor-proof or odor-resistant. There are deodorants that can be used in the pouch. Medications are also available to help reduce odor. Limit gas-producing foods and carbonated beverages. You will experience less gas and fewer noises as you heal from surgery.  How much time will it take to care for my ostomy? At first, you may  spend a lot of time learning about your ostomy and how to take care of it. As you become more comfortable and skilled at changing the pouching system, it will take very little time to care for it.   Will I be able to return to work? People with ostomies can perform most jobs. As soon as you have healed from surgery, you should be able to return to work. Heavy lifting (more than 10 pounds) may be discouraged.   What about intimacy? Sexual relationships and intimacy are important and fulfilling aspects of your life. They should continue after ostomy surgery. Intimacy-related concerns should be discussed openly between you and your partner.   Can I wear regular clothing? You do not need to wear special clothing. Ostomy pouches are fairly flat and barely noticeable. Elastic undergarments will not hurt the stoma or prevent the ostomy from functioning.   Can I participate in sports? An ostomy should not limit your involvement in sports. Many people with ostomies are runners, skiers, swimmers or participate in other active lifestyles. Talk with your caregiver first before doing heavy physical activity.  Will you starve?  Not if you follow doctor's orders at each stage of your post-op adjustment. There is no such thing as an "ostomy diet". Some people with an ostomy will be able to eat and tolerate anything; others may find diffi-culty with some foods. Each person is an individual and must determine, by trial, what is best for them. A good practice for all is to drink plenty of water.   Will you be a social outcast?  Have you met anyone who has an ostomy and is a social outcast? Why should you be the first? Only your attitude and self image will effect how you are treated. No confi-dent person is an outcast.    PROFESSIONAL HELP   Resources are available if you need help or have questions about your ostomy.   Specially trained nurses called Wound, Ostomy Continence Nurses (WOCN) are available for  consultation in most major medical centers.  Consider getting an ostomy consult at an outpatient ostomy clinic.   [place] has an Ostomy Clinic run by an WOCN ostomy nurse at the [facility].  [phone]. [facility] can help set up an appointment   The United Ostomy Association (UOA) is a group made up of many local chapters throughout the [place]. These local groups hold meetings and provide support to prospective and existing ostomates. They sponsor educational events and have qualified visitors to make personal or telephone visits. Contact the UOA for the chapter nearest you and for other educational publications.  More detailed information can be found in Colostomy Guide, a publication of the United Ostomy Association (UOA). Contact UOA at 1-800-826-0826 or visit their web site at www.uoaa.org. The website contains links to other sites, suppliers and resources.  [facility] Secure Start Services: Start at the website to enlist for support.  Your Wound Ostomy (WOCN) nurse may have started this   process. https://www.[employer].com/en/securestart Secure Start services are designed to support people as they live their lives with an ostomy or neurogenic bladder. Enrolling is easy and at no cost to the patient. We realize that each person's needs and life journey are different. Through Secure Start services, we want to help people live their life, their way.  #######################################################  

## 2021-07-23 ENCOUNTER — Telehealth: Payer: Self-pay

## 2021-07-23 ENCOUNTER — Other Ambulatory Visit: Payer: Self-pay

## 2021-07-23 DIAGNOSIS — C189 Malignant neoplasm of colon, unspecified: Secondary | ICD-10-CM

## 2021-07-23 NOTE — Telephone Encounter (Signed)
-----   Message from Milus Banister, MD sent at 07/23/2021  7:57 AM EDT ----- Marykay Lex, she needs recall colonoscopy 02/2022. Also needs chest CT to follow up right lung nodule next month (08/2021).  Thanks

## 2021-07-23 NOTE — Progress Notes (Signed)
The proposed treatment discussed in conference is for discussion purpose only and is not a binding recommendation.  The patients have not been physically examined, or presented with their treatment options.  Therefore, final treatment plans cannot be decided.  

## 2021-07-23 NOTE — Telephone Encounter (Signed)
Recall entered and staff message to call pt next month to setup CT

## 2021-07-24 ENCOUNTER — Telehealth: Payer: Self-pay | Admitting: Genetic Counselor

## 2021-07-24 NOTE — Telephone Encounter (Signed)
Sydney Goodwin has been cld and scheduled to see Cari on 9/1 at 2pm. Pt aware to arrive 15 minutes early.

## 2021-08-05 ENCOUNTER — Other Ambulatory Visit: Payer: Self-pay

## 2021-08-05 DIAGNOSIS — Z6831 Body mass index (BMI) 31.0-31.9, adult: Secondary | ICD-10-CM | POA: Diagnosis not present

## 2021-08-05 DIAGNOSIS — Z01419 Encounter for gynecological examination (general) (routine) without abnormal findings: Secondary | ICD-10-CM | POA: Diagnosis not present

## 2021-08-05 DIAGNOSIS — R911 Solitary pulmonary nodule: Secondary | ICD-10-CM

## 2021-08-05 DIAGNOSIS — Z124 Encounter for screening for malignant neoplasm of cervix: Secondary | ICD-10-CM | POA: Diagnosis not present

## 2021-08-05 DIAGNOSIS — Z1231 Encounter for screening mammogram for malignant neoplasm of breast: Secondary | ICD-10-CM | POA: Diagnosis not present

## 2021-08-07 ENCOUNTER — Other Ambulatory Visit: Payer: Self-pay

## 2021-08-07 ENCOUNTER — Inpatient Hospital Stay: Payer: BC Managed Care – PPO

## 2021-08-07 ENCOUNTER — Inpatient Hospital Stay: Payer: BC Managed Care – PPO | Attending: Genetic Counselor | Admitting: Genetic Counselor

## 2021-08-07 ENCOUNTER — Encounter: Payer: Self-pay | Admitting: Genetic Counselor

## 2021-08-07 DIAGNOSIS — Z800 Family history of malignant neoplasm of digestive organs: Secondary | ICD-10-CM | POA: Insufficient documentation

## 2021-08-07 DIAGNOSIS — C187 Malignant neoplasm of sigmoid colon: Secondary | ICD-10-CM | POA: Diagnosis not present

## 2021-08-07 DIAGNOSIS — C189 Malignant neoplasm of colon, unspecified: Secondary | ICD-10-CM | POA: Diagnosis not present

## 2021-08-07 DIAGNOSIS — Z803 Family history of malignant neoplasm of breast: Secondary | ICD-10-CM

## 2021-08-07 LAB — GENETIC SCREENING ORDER

## 2021-08-07 NOTE — Progress Notes (Signed)
REFERRING PROVIDER: Milus Banister, MD [address],  [place] [address]  PRIMARY PROVIDER:  Patient, No Pcp Per (Inactive)  PRIMARY REASON FOR VISIT:  Personal history of colon cancer Family history of colon cancer Family history of breast cancer  HISTORY OF PRESENT ILLNESS:   Ms. Beckstrand, a 45 y.o. female, was seen for a [facility] cancer genetics consultation at the request of Dr. Ardis Hughs due to a personal and family history of colon cancer.  Ms. Astarita presents to clinic today to discuss the possibility of a hereditary predisposition to cancer, to discuss genetic testing, and to further clarify her future cancer risks, as well as potential cancer risks for family members.   In March 2022, at the age of 19, Ms. Garton was diagnosed with colon cancer. MMR/IHC testing was normal.   RISK FACTORS:  Menarche was at age 39.  First live birth at age 15.  OCP use for approximately 7 years.  Ovaries intact: yes.  Uterus intact: yes.  Menopausal status: premenopausal.  HRT use: 0 years. Mammogram within the last year: yes. Number of breast biopsies: 0. Any excessive radiation exposure in the past: no  Past Medical History:  Diagnosis Date   Anxiety    Cancer ([place])    Depression    GERD (gastroesophageal reflux disease)    with pregnancy   Headache    Postpartum care following cesarean delivery (08/24/13) 08/24/2013   Status post C-section 08/24/2013   Varicose veins    Left Leg    Past Surgical History:  Procedure Laterality Date   CESAREAN SECTION     CESAREAN SECTION WITH BILATERAL TUBAL LIGATION Bilateral 08/24/2013   Procedure: REPEAT CESAREAN SECTION WITH BILATERAL TUBAL LIGATION;  Surgeon: Lovenia Kim, MD;  Location: [facility];  Service: Obstetrics;  Laterality: Bilateral;   DILITATION & CURRETTAGE/HYSTROSCOPY WITH NOVASURE ABLATION N/A 11/25/2017   Procedure: HYSTEROSCOPY;  Surgeon: Brien Few, MD;  Location: [facility];  Service: Gynecology;  Laterality:  N/A;   DILITATION & CURRETTAGE/HYSTROSCOPY WITH NOVASURE ABLATION N/A 02/11/2018   Procedure: ULTRASOUND GUIDED DILATATION & CURETTAGE/HYSTEROSCOPY WITH NOVASURE ABLATION;  Surgeon: Brien Few, MD;  Location: [facility];  Service: Gynecology;  Laterality: N/A;   TONSILLECTOMY      Social History   Socioeconomic History   Marital status: Married    Spouse name: Not on file   Number of children: Not on file   Years of education: Not on file   Highest education level: Not on file  Occupational History   Not on file  Tobacco Use   Smoking status: Never   Smokeless tobacco: Never  Vaping Use   Vaping Use: Never used  Substance and Sexual Activity   Alcohol use: Yes    Comment: occ   Drug use: No   Sexual activity: Yes    Birth control/protection: Surgical  Other Topics Concern   Not on file  Social History Narrative   Not on file   Social Determinants of Health   Financial Resource Strain: Not on file  Food Insecurity: Not on file  Transportation Needs: Not on file  Physical Activity: Not on file  Stress: Not on file  Social Connections: Not on file     FAMILY HISTORY:  We obtained a detailed, 4-generation family history.  Significant diagnoses are listed below:  Family History  Problem Relation Age of Onset   Colon polyps Mother    Colon cancer Mother 6   Colon cancer Maternal Grandfather 55  Breast cancer Maternal Cousin 58   Ms. Pichon is unaware of previous family history of genetic testing for hereditary cancer risks. There no reported Ashkenazi Jewish ancestry.       GENETIC COUNSELING ASSESSMENT: Ms. Southgate is a 45 y.o. female with a personal and family history of colon cancer which is somewhat suggestive of hereditary cancer. Therefore, we discussed and recommended the following at today's visit.   DISCUSSION: We discussed that 5 - 10% of cancer is hereditary, with most cases of hereditary colon cancer associated with Lynch Syndrome.  Of note, her IHC  testing was normal. There are other genes that can be associated with hereditary colon cancer. We discussed that testing is beneficial for several reasons including knowing how to follow individuals after completing their treatment, identifying whether potential treatment options would be beneficial, and understanding if other family members could be at risk for cancer and allowing them to undergo genetic testing.   We reviewed the characteristics, features and inheritance patterns of hereditary cancer syndromes. We also discussed genetic testing, including the appropriate family members to test, the process of testing, insurance coverage and turn-around-time for results. We discussed the implications of a negative, positive, carrier and/or variant of uncertain significant result. We recommended Ms. Balducci pursue genetic testing for a panel that includes genes associated with colon and breast cancer.   Ms. Barkey  was offered a common hereditary cancer panel (47 genes) and an expanded pan-cancer panel (84 genes). Ms. Cherne was informed of the benefits and limitations of each panel, including that expanded pan-cancer panels contain genes that do not have clear management guidelines at this point in time.  We also discussed that as the number of genes included on a panel increases, the chances of variants of uncertain significance increases.  After considering the benefits and limitations of each gene panel, Ms. Nier  elected to have [facility]'s Multi-Cancer Panel (84 genes).  Based on Ms. Basich's personal and family history of cancer, she meets medical criteria for genetic testing. Despite that she meets criteria, she may still have an out of pocket cost.   PLAN: After considering the risks, benefits, and limitations, Ms. Petra Kuba provided informed consent to pursue genetic testing and the blood sample was sent to [facility] for analysis of the Multi-Cancer Panel+RNA (84 genes). Results should  be available within approximately 2-3 weeks' time, at which point they will be disclosed by telephone to Ms. Petra Kuba, as will any additional recommendations warranted by these results. Ms. Mink will receive a summary of her genetic counseling visit and a copy of her results once available. This information will also be available in Epic.   Ms. Reicks questions were answered to her satisfaction today. Our contact information was provided should additional questions or concerns arise. Thank you for the referral and allowing [place] to share in the care of your patient.   Lew Dawes, MS, [facility] Genetic Counselor [email or web address] (P) [phone]  The patient was seen for a total of 45 minutes in face-to-face genetic counseling. The patient was seen alone.  Drs. Magrinat, Lindi Adie and/or Burr Medico were available to discuss this case as needed.  _______________________________________________________________________ For Office Staff:  Number of people involved in session: 1 Was an Intern/ student involved with case: no

## 2021-08-19 ENCOUNTER — Ambulatory Visit (HOSPITAL_COMMUNITY)
Admission: RE | Admit: 2021-08-19 | Discharge: 2021-08-19 | Disposition: A | Discharge status: Home / Self Care | Payer: BC Managed Care – PPO | Source: Ambulatory Visit | Attending: Gastroenterology | Admitting: Gastroenterology

## 2021-08-19 ENCOUNTER — Other Ambulatory Visit: Payer: Self-pay

## 2021-08-19 ENCOUNTER — Encounter (HOSPITAL_COMMUNITY): Payer: Self-pay

## 2021-08-19 DIAGNOSIS — R911 Solitary pulmonary nodule: Secondary | ICD-10-CM | POA: Insufficient documentation

## 2021-08-19 MED ORDER — IOHEXOL 350 MG/ML SOLN
75.0000 mL | Freq: Once | INTRAVENOUS | Status: AC | PRN
  Administered 2021-08-19: 60 mL via INTRAVENOUS

## 2021-09-03 ENCOUNTER — Telehealth: Payer: Self-pay | Admitting: Genetic Counselor

## 2021-09-03 ENCOUNTER — Encounter: Payer: Self-pay | Admitting: Genetic Counselor

## 2021-09-03 NOTE — Telephone Encounter (Signed)
I attempted to contact Ms. Hauschild to discuss her genetic test results. Her voicemail box is full. Therefore, I will send a MyChart message.  Lucille Passy, MS, [facility] Genetic Counselor [email or web address] (P) [phone]

## 2021-09-04 ENCOUNTER — Ambulatory Visit: Payer: Self-pay | Admitting: Genetic Counselor

## 2021-09-04 DIAGNOSIS — C187 Malignant neoplasm of sigmoid colon: Secondary | ICD-10-CM

## 2021-09-04 NOTE — Progress Notes (Addendum)
HPI:   Sydney Goodwin was previously seen in the [place] clinic due to a personal and family history of cancer and concerns regarding a hereditary predisposition to cancer. Please refer to our prior cancer genetics clinic note for more information regarding our discussion, assessment and recommendations, at the time. Sydney Goodwin recent genetic test results were disclosed to her, as were recommendations warranted by these results. These results and recommendations are discussed in more detail below.  CANCER HISTORY:  In March 2022, at the age of 45, Sydney Goodwin was diagnosed with colon cancer. MMR/IHC testing was normal.   FAMILY HISTORY:  We obtained a detailed, 4-generation family history.  Significant diagnoses are listed below:        Family History  Problem Relation Age of Onset   Colon polyps Mother     Colon cancer Mother 66   Colon cancer Maternal Grandfather 20   Breast cancer Maternal Cousin 16    Sydney Goodwin is unaware of previous family history of genetic testing for hereditary cancer risks. There no reported Ashkenazi Jewish ancestry.        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 Specifically, this variant is c.1187G>A.  Genetic testing identified a variant of uncertain significance (VUS) in the TERT gene called c.3332C>T.  At this time, it is unknown if this variant is associated with increased risk for cancer or if it is benign, but most uncertain variants are reclassified to benign. It should not be used to make medical management decisions. With time, we suspect the laboratory will determine the significance of this variant, if any. If the laboratory reclassifies this variant, we will attempt to contact Sydney Goodwin to discuss it further.   The test report has been scanned into EPIC and is located under the  Molecular Pathology section of the Results Review tab.  A portion of the result report is included below for reference. Genetic testing reported out on 08/29/2021.      Clinical Information: Changes in the MUTYH gene are associated with MUTYH-associated Polyposis syndrome (MAP). MAP is a hereditary cancer condition in which patients have high risks for colorectal cancer due to a large number of adenomatous polyps in the gastrointestinal system. MAP is unique among the hereditary cancer syndromes in that it is a "recessive" condition. Most hereditary cancer conditions are "dominant", meaning the condition is present in patients with a mutation in one copy of the gene. Patients have MAP only if there are mutations in both of their copies of the MUTYH gene. 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 It is estimated that 1% to 2% of the population has a mutation in one copy of the MUTYH gene. These individuals do not have MAP.   There is conflicting information regarding colon cancer risks for MUTYH carriers. Some studies have reported a 2-3 fold increased risk-- more so in individuals with a family history of colon cancer. Other studies have found no substantial evidence supporting MUTYH carriers having increased risks of colon cancer. At this time, the [employer]  recommends screening for MUTYH carriers based on family history of colon cancer.  Research is continuing to help learn more about the cancers associated with MUTYH pathogenic variants and what the exact risks are to develop these cancers.  Management Recommendations for individuals with a single MUTYH pathogenic variant:  Colon Cancer Screening: If an individual has a first-degree relative with colorectal cancer, screening should begin at age 52 or 65 years prior to the relative's age at diagnosis, whichever comes first and repeat  colonoscopies every 5 years.   If an individual has no first-degree relatives with colorectal cancer, data is unclear if specialized screening is warranted. We advise these patients to follow their gastroenterologists recommendations.  This information is based on current understanding of the gene and may change in the future.  Implications for Family Members: Sydney Goodwin first degree relatives are at a 50% risk for also having inherited the MUTYH mutation. Family members may consider genetic testing for this familial pathogenic variant. As there are generally no childhood cancer risks associated with pathogenic variants in the MUTYH gene, individuals in the family are not recommended to have testing until they reach at least 47 years of age. They may contact our office at [phone] for more information or to schedule an appointment.  Complimentary testing for the familial variant is available for 90 days from the report date.  Family members who live outside of the area are encouraged to find a genetic counselor in their area by visiting: [email or web address].   **Of note, her MUTYH mutation most likely does not explain her personal and family history of colon cancer. Therefore, even if family members have negative genetic testing, they are recommended to continue colon cancer screening based on their personal and family history. Her children and brother would all be recommended to begin colonoscopies at age 74 and repeat every 5 years regardless of their MUTYH genetic test results. More frequent colonoscopies may be recommended if polyps are identified.  Resources: FORCE (Facing Our Risk of Cancer Empowered) is a resource for those with a hereditary predisposition to develop cancer.  FORCE provides information about risk reduction, advocacy, legislation, and clinical trials.  Additionally, FORCE provides a platform for collaboration and support; which includes: peer  navigation, message boards, local support groups, a toll-free helpline, research registry and recruitment, advocate training, published medical research, webinars, brochures, mastectomy photos, and more.  For more information, visit www.facingourrisk.org  Our contact number was provided. Sydney Goodwin questions were answered to her satisfaction, and she knows she is welcome to call us at anytime with additional questions or concerns.   Sydney Passy, MS, [facility] Genetic Counselor [email or web address] (P) [phone]

## 2021-09-05 ENCOUNTER — Encounter: Payer: Self-pay | Admitting: Genetic Counselor

## 2021-09-05 ENCOUNTER — Telehealth: Payer: Self-pay | Admitting: Genetic Counselor

## 2021-09-05 DIAGNOSIS — Z1589 Genetic susceptibility to other disease: Secondary | ICD-10-CM | POA: Insufficient documentation

## 2021-09-05 DIAGNOSIS — Z1379 Encounter for other screening for genetic and chromosomal anomalies: Secondary | ICD-10-CM | POA: Insufficient documentation

## 2021-09-05 NOTE — Telephone Encounter (Signed)
I contacted Sydney Goodwin to discuss her genetic test results. [facility] analyzed 84 genes. 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 Genetic testing identified a variant of uncertain significance (VUS) in the TERT gene called c.3332C>T. Please review clinic note for detailed recommendations. Results can be found in the media tab.   **Of note, her MUTYH mutation most likely does not explain her personal and family history of colon cancer. Therefore, even if family members have negative genetic testing, they are recommended to continue colon cancer screening based on their personal and family history.

## 2021-09-08 ENCOUNTER — Ambulatory Visit: Payer: BC Managed Care – PPO | Admitting: Cardiology

## 2021-09-08 ENCOUNTER — Other Ambulatory Visit: Payer: Self-pay

## 2021-09-08 ENCOUNTER — Encounter: Payer: Self-pay | Admitting: Cardiology

## 2021-09-08 VITALS — BP 122/81 | HR 96 | Temp 98.1°F | Resp 16 | Ht 65.0 in | Wt 187.2 lb

## 2021-09-08 DIAGNOSIS — R010 Benign and innocent cardiac murmurs: Secondary | ICD-10-CM

## 2021-09-08 DIAGNOSIS — Z8249 Family history of ischemic heart disease and other diseases of the circulatory system: Secondary | ICD-10-CM

## 2021-09-08 DIAGNOSIS — Z13220 Encounter for screening for lipoid disorders: Secondary | ICD-10-CM

## 2021-09-08 DIAGNOSIS — C189 Malignant neoplasm of colon, unspecified: Secondary | ICD-10-CM | POA: Diagnosis not present

## 2021-09-08 NOTE — Progress Notes (Signed)
Date:  09/08/2021   ID:  [name], DOB [birth year], MRN [id number]  PCP:  Patient, No Pcp Per (Inactive)  Cardiologist:  Rex Kras, DO, [facility] (established care 09/08/21 )  REASON FOR CONSULT: Family history of heart disease  REQUESTING PHYSICIAN:  Brien Few, MD [address] [place],  [place] [address]  Chief Complaint  Patient presents with   Family history of heart disease   New Patient (Initial Visit)    HPI  Sydney Goodwin is a 45 y.o. female who presents to the office with a chief complaint of " family history of heart disease." Patient's past medical history and cardiovascular risk factors include: Hx of colon cancer (dx 2022).   She is referred to the office at the request of Brien Few, MD for evaluation of family history of heart disease.  Patient had been scheduled for routine colonoscopy and was diagnosed with colon cancer underwent sigmoidectomy.  Now presents to cardiology for evaluation of heart disease given her family history of CAD.  Patient denies any active chest pain or heart failure symptoms.  Patient states that her father had a quadruple bypass at the age of 26.  And she has had multiple for members on the paternal side who have heart disease but other contributing factors may include unhealthy lifestyles, obesity/excess weight, and smoking.  She denies premature CAD in the family, cardiomyopathy, sudden cardiac death.  Patient tries to walk her dog 2 or 3 times per week leisurely.  Otherwise no structured exercise program or daily routine.  ALLERGIES: Allergies  Allergen Reactions   Morphine And Related Itching and Other (See Comments)    Itching from past surgeries.   Hydromorphone Other (See Comments)    Itching from past surgeries.   Penicillins Itching, Rash and Other (See Comments)    Has patient had a PCN reaction causing immediate rash, facial/tongue/throat swelling, SOB or lightheadedness with hypotension: Yes Has  patient had a PCN reaction causing severe rash involving mucus membranes or skin necrosis: No Has patient had a PCN reaction that required hospitalization No Has patient had a PCN reaction occurring within the last 10 years: No If all of the above answers are "NO", then may proceed with Cephalosporin use.     MEDICATION LIST PRIOR TO VISIT: Current Meds  Medication Sig   calcium carbonate (TUMS EX) 750 MG chewable tablet Chew 2 tablets by mouth daily as needed for heartburn.   diphenhydrAMINE (BENADRYL) 25 MG tablet Take 50 mg by mouth at bedtime as needed for itching or sleep.   ibuprofen (ADVIL,MOTRIN) 200 MG tablet Take 400 mg by mouth every 8 (eight) hours as needed for headache or mild pain.    venlafaxine XR (EFFEXOR-XR) 150 MG 24 hr capsule Take 150 mg by mouth daily.     PAST MEDICAL HISTORY: Past Medical History:  Diagnosis Date   Anxiety    Cancer ([place])    Depression    GERD (gastroesophageal reflux disease)    with pregnancy   Headache    Postpartum care following cesarean delivery (08/24/13) 08/24/2013   Status post C-section 08/24/2013   Varicose veins    Left Leg    PAST SURGICAL HISTORY: Past Surgical History:  Procedure Laterality Date   CESAREAN SECTION     CESAREAN SECTION WITH BILATERAL TUBAL LIGATION Bilateral 08/24/2013   Procedure: REPEAT CESAREAN SECTION WITH BILATERAL TUBAL LIGATION;  Surgeon: Lovenia Kim, MD;  Location: [facility];  Service: Obstetrics;  Laterality: Bilateral;  DILITATION & CURRETTAGE/HYSTROSCOPY WITH NOVASURE ABLATION N/A 11/25/2017   Procedure: HYSTEROSCOPY;  Surgeon: Brien Few, MD;  Location: [facility];  Service: Gynecology;  Laterality: N/A;   DILITATION & CURRETTAGE/HYSTROSCOPY WITH NOVASURE ABLATION N/A 02/11/2018   Procedure: ULTRASOUND GUIDED DILATATION & CURETTAGE/HYSTEROSCOPY WITH NOVASURE ABLATION;  Surgeon: Brien Few, MD;  Location: [facility];  Service: Gynecology;  Laterality: N/A;   SIGMOIDECTOMY      TONSILLECTOMY      FAMILY HISTORY: The patient family history includes Breast cancer (age of onset: 81) in an other family member; Colon cancer (age of onset: 66) in her mother; Colon cancer (age of onset: 73) in her maternal grandfather; Colon polyps in her mother; Hyperlipidemia in her father; Varicose Veins in her father and mother.  SOCIAL HISTORY:  The patient  reports that she has never smoked. She has never used smokeless tobacco. She reports current alcohol use. She reports that she does not use drugs.  REVIEW OF SYSTEMS: Review of Systems  Constitutional: Negative for chills and fever.  HENT:  Negative for hoarse voice and nosebleeds.   Eyes:  Negative for discharge, double vision and pain.  Cardiovascular:  Negative for chest pain, claudication, dyspnea on exertion, leg swelling, near-syncope, orthopnea, palpitations, paroxysmal nocturnal dyspnea and syncope.  Respiratory:  Negative for hemoptysis and shortness of breath.   Musculoskeletal:  Negative for muscle cramps and myalgias.  Gastrointestinal:  Negative for abdominal pain, constipation, diarrhea, hematemesis, hematochezia, melena, nausea and vomiting.  Neurological:  Negative for dizziness and light-headedness.   PHYSICAL EXAM: Vitals with BMI 09/08/2021 06/06/2021 06/06/2021  Height 5' 5"  - -  Weight 187 lbs 3 oz - 182 lbs 5 oz  BMI 37.10 - 62.69  Systolic 485 462 -  Diastolic 81 85 -  Pulse 96 95 -    CONSTITUTIONAL: Well-developed and well-nourished. No acute distress.  SKIN: Skin is warm and dry. No rash noted. No cyanosis. No pallor. No jaundice HEAD: Normocephalic and atraumatic.  EYES: No scleral icterus MOUTH/THROAT: Moist oral membranes.  NECK: No JVD present. No thyromegaly noted. No carotid bruits  LYMPHATIC: No visible cervical adenopathy.  CHEST Normal respiratory effort. No intercostal retractions  LUNGS: Clear to auscultation bilaterally.  No stridor. No wheezes. No rales.  CARDIOVASCULAR: Regular,  positive S1-S2, 2 out of 6 systolic murmur, no gallops or rubs ABDOMINAL: Soft, nontender, nondistended, positive bowel sounds in all 4 quadrants, no apparent ascites.  EXTREMITIES: No peripheral edema, 2+ DP and PT pulses. HEMATOLOGIC: No significant bruising NEUROLOGIC: Oriented to person, place, and time. Nonfocal. Normal muscle tone.  PSYCHIATRIC: Normal mood and affect. Normal behavior. Cooperative  CARDIAC DATABASE: EKG: 09/08/2021: Normal sinus rhythm, 92 bpm, without underlying ischemia or injury pattern.  Echocardiogram: No results found for this or any previous visit from the past 1095 days.   Stress Testing: No results found for this or any previous visit from the past 1095 days.  Heart Catheterization: None  LABORATORY DATA: CBC Latest Ref Rng & Units 06/06/2021 06/05/2021 05/28/2021  WBC 4.0 - 10.5 K/uL 7.7 12.0(H) 6.6  Hemoglobin 12.0 - 15.0 g/dL 10.9(L) 10.8(L) 13.3  Hematocrit 36.0 - 46.0 % 33.9(L) 32.0(L) 39.8  Platelets 150 - 400 K/uL 220 214 264    CMP Latest Ref Rng & Units 06/06/2021 06/05/2021 02/11/2018  Glucose 70 - 99 mg/dL 93 130(H) 99  BUN 6 - 20 mg/dL 10 7 13   Creatinine 0.44 - 1.00 mg/dL 0.70 0.65 0.66  Sodium 135 - 145 mmol/L 139 135 138  Potassium  3.5 - 5.1 mmol/L 3.9 3.7 4.4  Chloride 98 - 111 mmol/L 106 106 108  CO2 22 - 32 mmol/L 27 25 22   Calcium 8.9 - 10.3 mg/dL 8.4(L) 8.3(L) 8.8(L)  Total Protein 6.0 - 8.3 g/dL - - -  Total Bilirubin 0.3 - 1.2 mg/dL - - -  Alkaline Phos 39 - 117 U/L - - -  AST 0 - 37 U/L - - -  ALT 0 - 35 U/L - - -    Lipid Panel  No results found for: CHOL, TRIG, HDL, CHOLHDL, VLDL, LDLCALC, LDLDIRECT, LABVLDL  No components found for: NTPROBNP No results for input(s): PROBNP in the last 8760 hours. No results for input(s): TSH in the last 8760 hours.  BMP Recent Labs    06/05/21 0410 06/06/21 0346  NA 135 139  K 3.7 3.9  CL 106 106  CO2 25 27  GLUCOSE 130* 93  BUN 7 10  CREATININE 0.65 0.70  CALCIUM 8.3* 8.4*   GFRNONAA >60 >60    HEMOGLOBIN A1C No results found for: HGBA1C, MPG  IMPRESSION:    ICD-10-CM   1. Family history of heart disease  Z82.49 EKG 12-Lead    CT CARDIAC SCORING ([facility] LOCATIONS ONLY)    2. Functional cardiac murmur  R01.0 PCV ECHOCARDIOGRAM COMPLETE    3. Screening for lipid disorders  Z13.220 Lipid Panel With LDL/HDL Ratio    LDL cholesterol, direct    CMP14+EGFR    4. Hx of colon cancer  C18.9        RECOMMENDATIONS: Sydney Goodwin is a 45 y.o. female whose past medical history: Hx of colon cancer (dx 2022).  Family history of heart disease EKG: Nonischemic. No active chest pain or heart failure symptoms. Plan calcium score for further restratification.  Functional cardiac murmur Echocardiogram will be ordered to evaluate for structural heart disease and left ventricular systolic function.  Screening for lipid disorders Check fasting lipid profile, direct LDL, and CMP  Hx of colon cancer Currently follows up with her providers for routine follow-up.  Patient is encouraged to establish care with primary medicine to be evaluated for other chronic comorbid conditions and age-appropriate cancer screening.  Patient verbalizes understanding and will have an appointment to see someone early next year.  FINAL MEDICATION LIST END OF ENCOUNTER: No orders of the defined types were placed in this encounter.   Medications Discontinued During This Encounter  Medication Reason   metroNIDAZOLE (FLAGYL) 500 MG tablet Error   traMADol (ULTRAM) 50 MG tablet Error     Current Outpatient Medications:    calcium carbonate (TUMS EX) 750 MG chewable tablet, Chew 2 tablets by mouth daily as needed for heartburn., Disp: , Rfl:    diphenhydrAMINE (BENADRYL) 25 MG tablet, Take 50 mg by mouth at bedtime as needed for itching or sleep., Disp: , Rfl:    ibuprofen (ADVIL,MOTRIN) 200 MG tablet, Take 400 mg by mouth every 8 (eight) hours as needed for headache or mild pain. ,  Disp: , Rfl:    venlafaxine XR (EFFEXOR-XR) 150 MG 24 hr capsule, Take 150 mg by mouth daily., Disp: , Rfl:   Orders Placed This Encounter  Procedures   CT CARDIAC SCORING ([facility] LOCATIONS ONLY)   Lipid Panel With LDL/HDL Ratio   LDL cholesterol, direct   CMP14+EGFR   EKG 12-Lead   PCV ECHOCARDIOGRAM COMPLETE    There are no Patient Instructions on file for this visit.   --Continue cardiac medications as reconciled in final  medication list. --Return in about 4 weeks (around 10/06/2021) for Review test results. Or sooner if needed. --Continue follow-up with your primary care physician regarding the management of your other chronic comorbid conditions.  Patient's questions and concerns were addressed to her satisfaction. She voices understanding of the instructions provided during this encounter.   This note was created using a voice recognition software as a result there may be grammatical errors inadvertently enclosed that do not reflect the nature of this encounter. Every attempt is made to correct such errors.  Rex Kras, [place], [facility]  Pager: [phone] Office: [phone]

## 2021-09-16 DIAGNOSIS — Z79899 Other long term (current) drug therapy: Secondary | ICD-10-CM | POA: Diagnosis not present

## 2021-09-16 DIAGNOSIS — E349 Endocrine disorder, unspecified: Secondary | ICD-10-CM | POA: Diagnosis not present

## 2021-09-16 DIAGNOSIS — R5383 Other fatigue: Secondary | ICD-10-CM | POA: Diagnosis not present

## 2021-09-16 DIAGNOSIS — Z1159 Encounter for screening for other viral diseases: Secondary | ICD-10-CM | POA: Diagnosis not present

## 2021-09-16 DIAGNOSIS — R635 Abnormal weight gain: Secondary | ICD-10-CM | POA: Diagnosis not present

## 2021-09-16 DIAGNOSIS — D539 Nutritional anemia, unspecified: Secondary | ICD-10-CM | POA: Diagnosis not present

## 2021-09-16 DIAGNOSIS — Z1331 Encounter for screening for depression: Secondary | ICD-10-CM | POA: Diagnosis not present

## 2021-09-16 DIAGNOSIS — N914 Secondary oligomenorrhea: Secondary | ICD-10-CM | POA: Diagnosis not present

## 2021-09-16 DIAGNOSIS — E559 Vitamin D deficiency, unspecified: Secondary | ICD-10-CM | POA: Diagnosis not present

## 2021-09-16 DIAGNOSIS — R0602 Shortness of breath: Secondary | ICD-10-CM | POA: Diagnosis not present

## 2021-09-16 DIAGNOSIS — E7800 Pure hypercholesterolemia, unspecified: Secondary | ICD-10-CM | POA: Diagnosis not present

## 2021-09-16 DIAGNOSIS — E669 Obesity, unspecified: Secondary | ICD-10-CM | POA: Diagnosis not present

## 2021-09-16 DIAGNOSIS — Z131 Encounter for screening for diabetes mellitus: Secondary | ICD-10-CM | POA: Diagnosis not present

## 2021-09-16 DIAGNOSIS — E8881 Metabolic syndrome: Secondary | ICD-10-CM | POA: Diagnosis not present

## 2021-09-18 ENCOUNTER — Other Ambulatory Visit: Payer: Self-pay

## 2021-09-18 ENCOUNTER — Ambulatory Visit: Payer: BC Managed Care – PPO

## 2021-09-18 DIAGNOSIS — R010 Benign and innocent cardiac murmurs: Secondary | ICD-10-CM | POA: Diagnosis not present

## 2021-10-04 DIAGNOSIS — B349 Viral infection, unspecified: Secondary | ICD-10-CM | POA: Diagnosis not present

## 2021-10-04 DIAGNOSIS — J029 Acute pharyngitis, unspecified: Secondary | ICD-10-CM | POA: Diagnosis not present

## 2021-10-04 DIAGNOSIS — Z03818 Encounter for observation for suspected exposure to other biological agents ruled out: Secondary | ICD-10-CM | POA: Diagnosis not present

## 2021-10-09 DIAGNOSIS — I788 Other diseases of capillaries: Secondary | ICD-10-CM | POA: Diagnosis not present

## 2021-10-09 DIAGNOSIS — L700 Acne vulgaris: Secondary | ICD-10-CM | POA: Diagnosis not present

## 2021-10-09 DIAGNOSIS — D1801 Hemangioma of skin and subcutaneous tissue: Secondary | ICD-10-CM | POA: Diagnosis not present

## 2021-10-09 DIAGNOSIS — D225 Melanocytic nevi of trunk: Secondary | ICD-10-CM | POA: Diagnosis not present

## 2021-10-10 ENCOUNTER — Ambulatory Visit: Payer: BC Managed Care – PPO | Admitting: Cardiology

## 2021-11-17 DIAGNOSIS — E559 Vitamin D deficiency, unspecified: Secondary | ICD-10-CM | POA: Diagnosis not present

## 2021-11-17 DIAGNOSIS — E7800 Pure hypercholesterolemia, unspecified: Secondary | ICD-10-CM | POA: Diagnosis not present

## 2021-11-17 DIAGNOSIS — R635 Abnormal weight gain: Secondary | ICD-10-CM | POA: Diagnosis not present

## 2021-11-17 DIAGNOSIS — N914 Secondary oligomenorrhea: Secondary | ICD-10-CM | POA: Diagnosis not present

## 2021-11-17 DIAGNOSIS — E8881 Metabolic syndrome: Secondary | ICD-10-CM | POA: Diagnosis not present

## 2021-11-17 DIAGNOSIS — E669 Obesity, unspecified: Secondary | ICD-10-CM | POA: Diagnosis not present

## 2021-12-18 DIAGNOSIS — N914 Secondary oligomenorrhea: Secondary | ICD-10-CM | POA: Diagnosis not present

## 2021-12-18 DIAGNOSIS — E8881 Metabolic syndrome: Secondary | ICD-10-CM | POA: Diagnosis not present

## 2021-12-18 DIAGNOSIS — E559 Vitamin D deficiency, unspecified: Secondary | ICD-10-CM | POA: Diagnosis not present

## 2021-12-18 DIAGNOSIS — R635 Abnormal weight gain: Secondary | ICD-10-CM | POA: Diagnosis not present

## 2021-12-18 DIAGNOSIS — E7800 Pure hypercholesterolemia, unspecified: Secondary | ICD-10-CM | POA: Diagnosis not present

## 2021-12-18 DIAGNOSIS — E669 Obesity, unspecified: Secondary | ICD-10-CM | POA: Diagnosis not present

## 2022-01-22 ENCOUNTER — Ambulatory Visit: Payer: BC Managed Care – PPO | Admitting: Family Medicine

## 2022-01-22 DIAGNOSIS — E8881 Metabolic syndrome: Secondary | ICD-10-CM | POA: Diagnosis not present

## 2022-01-22 DIAGNOSIS — N914 Secondary oligomenorrhea: Secondary | ICD-10-CM | POA: Diagnosis not present

## 2022-01-22 DIAGNOSIS — R635 Abnormal weight gain: Secondary | ICD-10-CM | POA: Diagnosis not present

## 2022-01-22 DIAGNOSIS — E559 Vitamin D deficiency, unspecified: Secondary | ICD-10-CM | POA: Diagnosis not present

## 2022-01-22 DIAGNOSIS — E7800 Pure hypercholesterolemia, unspecified: Secondary | ICD-10-CM | POA: Diagnosis not present

## 2022-01-22 DIAGNOSIS — E669 Obesity, unspecified: Secondary | ICD-10-CM | POA: Diagnosis not present

## 2022-02-17 ENCOUNTER — Other Ambulatory Visit: Payer: Self-pay

## 2022-02-17 ENCOUNTER — Telehealth: Payer: Self-pay

## 2022-02-17 DIAGNOSIS — R911 Solitary pulmonary nodule: Secondary | ICD-10-CM

## 2022-02-17 NOTE — Telephone Encounter (Signed)
CT orders placed. Patient is aware & schedulers notified.  ?

## 2022-02-17 NOTE — Telephone Encounter (Signed)
-----   Message from Timothy Lasso, RN sent at 08/20/2021 10:00 AM EDT ----- ?The nodule in her lung is completely unchanged. She needs repeat chest CT in 6 months per radiologist recommendation.  Thanks ? ?

## 2022-02-23 DIAGNOSIS — E559 Vitamin D deficiency, unspecified: Secondary | ICD-10-CM | POA: Diagnosis not present

## 2022-02-23 DIAGNOSIS — R635 Abnormal weight gain: Secondary | ICD-10-CM | POA: Diagnosis not present

## 2022-02-23 DIAGNOSIS — E663 Overweight: Secondary | ICD-10-CM | POA: Diagnosis not present

## 2022-02-23 DIAGNOSIS — E7800 Pure hypercholesterolemia, unspecified: Secondary | ICD-10-CM | POA: Diagnosis not present

## 2022-02-23 DIAGNOSIS — E8881 Metabolic syndrome: Secondary | ICD-10-CM | POA: Diagnosis not present

## 2022-02-26 ENCOUNTER — Other Ambulatory Visit: Payer: Self-pay

## 2022-02-26 ENCOUNTER — Ambulatory Visit (HOSPITAL_COMMUNITY)
Admission: RE | Admit: 2022-02-26 | Discharge: 2022-02-26 | Disposition: A | Discharge status: Home / Self Care | Payer: BC Managed Care – PPO | Source: Ambulatory Visit | Attending: Gastroenterology | Admitting: Gastroenterology

## 2022-02-26 ENCOUNTER — Other Ambulatory Visit: Payer: Self-pay | Admitting: Gastroenterology

## 2022-02-26 DIAGNOSIS — R911 Solitary pulmonary nodule: Secondary | ICD-10-CM | POA: Diagnosis not present

## 2022-02-26 MED ORDER — SODIUM CHLORIDE (PF) 0.9 % IJ SOLN
INTRAMUSCULAR | Status: AC
  Filled 2022-02-26: qty 50

## 2022-03-30 DIAGNOSIS — E559 Vitamin D deficiency, unspecified: Secondary | ICD-10-CM | POA: Diagnosis not present

## 2022-03-30 DIAGNOSIS — R635 Abnormal weight gain: Secondary | ICD-10-CM | POA: Diagnosis not present

## 2022-03-30 DIAGNOSIS — D539 Nutritional anemia, unspecified: Secondary | ICD-10-CM | POA: Diagnosis not present

## 2022-03-30 DIAGNOSIS — Z131 Encounter for screening for diabetes mellitus: Secondary | ICD-10-CM | POA: Diagnosis not present

## 2022-03-30 DIAGNOSIS — E669 Obesity, unspecified: Secondary | ICD-10-CM | POA: Diagnosis not present

## 2022-03-30 DIAGNOSIS — E8881 Metabolic syndrome: Secondary | ICD-10-CM | POA: Diagnosis not present

## 2022-03-30 DIAGNOSIS — R5383 Other fatigue: Secondary | ICD-10-CM | POA: Diagnosis not present

## 2022-03-30 DIAGNOSIS — E7800 Pure hypercholesterolemia, unspecified: Secondary | ICD-10-CM | POA: Diagnosis not present

## 2022-03-30 DIAGNOSIS — Z20822 Contact with and (suspected) exposure to covid-19: Secondary | ICD-10-CM | POA: Diagnosis not present

## 2022-03-30 DIAGNOSIS — Z79899 Other long term (current) drug therapy: Secondary | ICD-10-CM | POA: Diagnosis not present

## 2022-04-24 ENCOUNTER — Encounter: Payer: Self-pay | Admitting: Gastroenterology

## 2022-06-24 ENCOUNTER — Telehealth: Payer: Self-pay | Admitting: Gastroenterology

## 2022-06-24 ENCOUNTER — Encounter: Payer: Self-pay | Admitting: Gastroenterology

## 2022-06-24 NOTE — Telephone Encounter (Signed)
Patient called to schedule her recall colonoscopy with Dr. Ardis Hughs.  She also is complaining of low pain underneath her right breast that worsens after meals.  She feels pretty sure it is her gallbladder.  She is requesting imaging of her gallbladder be ordered.  [place] would like the orders to be called to one of these facilities:  [facility], [phone] or  [facility], [phone]  Please call patient and advise.  Thank you.

## 2022-06-24 NOTE — Telephone Encounter (Signed)
The pt has been scheduled for previsit and colon. She also states that she has some RUQ pain after eating and wants to have some imaging ordered.  I have advised her that since she has only been seen for screening colon in 2022 we are not able to order any test without an evaluation. I have advised her to be seen at an Urgent care or ED for her symptoms.  The pt has been advised of the information and verbalized understanding.

## 2022-06-25 ENCOUNTER — Telehealth: Payer: Self-pay

## 2022-06-25 DIAGNOSIS — R1011 Right upper quadrant pain: Secondary | ICD-10-CM

## 2022-06-25 NOTE — Telephone Encounter (Signed)
-----   Message from Milus Banister, MD sent at 06/25/2022  2:02 PM EDT ----- Armandina Stammer is a friend and patient of mine.  [place], having what sounds like biliary colic.  I'm hoping you can order an abdominal US for RUQ pains.  She sent the names, numbers of two imaging facilities. I'm gonna send you a copy of a text with the info.  Her mobile number is correct in epic.  Thanks a bunch

## 2022-06-25 NOTE — Telephone Encounter (Signed)
I called 2 imaging centers where the pt is currently located and neither could accommodate the [place] at this time. The soonest appt was Aug.  They recommend the ED.  I have notified the pt and Dr Ardis Hughs of this information.  The pt would like to go ahead and get the US done in [place] when she is back in town.  I have sent that order to the schedulers to call the pt.  I have made her aware to expect that call.

## 2022-06-26 ENCOUNTER — Telehealth: Payer: Self-pay | Admitting: Gastroenterology

## 2022-06-26 DIAGNOSIS — R1011 Right upper quadrant pain: Secondary | ICD-10-CM

## 2022-06-26 NOTE — Telephone Encounter (Signed)
Patty, thank you for helping with her while she is out of town at [employer].  I spoke with her husband on the phone last night, she is generally to nervous and anxious to speak with me herself.  They know that if her symptoms dramatically worsen she needs to go to either urgent [facility] or an emergency room [place] beach.  She is going to decide if these intermittent symptoms are making her miserable enough at the beach that she just wants to come home and undergo testing here, if that is the case she will let us know.  For now can you please help arrange abdominal ultrasound on either Monday, August 7 or Tuesday, August 8 for intermittent right upper quadrant pains.  Also blood work of CBC and complete metabolic profile.  Thank you

## 2022-07-22 ENCOUNTER — Ambulatory Visit (HOSPITAL_COMMUNITY): Payer: BC Managed Care – PPO

## 2022-08-05 ENCOUNTER — Ambulatory Visit (AMBULATORY_SURGERY_CENTER): Payer: BC Managed Care – PPO | Admitting: *Deleted

## 2022-08-05 ENCOUNTER — Ambulatory Visit (HOSPITAL_COMMUNITY): Payer: BC Managed Care – PPO

## 2022-08-05 VITALS — Ht 65.0 in | Wt 184.4 lb

## 2022-08-05 DIAGNOSIS — Z85038 Personal history of other malignant neoplasm of large intestine: Secondary | ICD-10-CM

## 2022-08-05 DIAGNOSIS — Z86010 Personal history of colonic polyps: Secondary | ICD-10-CM

## 2022-08-05 MED ORDER — NA SULFATE-K SULFATE-MG SULF 17.5-3.13-1.6 GM/177ML PO SOLN: 1.0000 | Freq: Once | ORAL | 0 refills | Status: AC

## 2022-08-05 NOTE — Progress Notes (Signed)
  No trouble with anesthesia, denies being told they were difficult to intubate, or hx/fam hx of malignant hyperthermia per pt   No egg or soy allergy  No home oxygen use   No medications for weight loss taken  Pt is unsure which prep she'd like to do. Instructions given for both Suprep and Miralax.  Suprep sent into pharmacy  Pt informed that we do not do prior authorizations for prep

## 2022-08-14 ENCOUNTER — Encounter: Payer: Self-pay | Admitting: Gastroenterology

## 2022-08-26 ENCOUNTER — Encounter: Payer: BC Managed Care – PPO | Admitting: Gastroenterology

## 2022-08-26 ENCOUNTER — Ambulatory Visit (AMBULATORY_SURGERY_CENTER): Payer: BC Managed Care – PPO | Admitting: Gastroenterology

## 2022-08-26 ENCOUNTER — Encounter: Payer: Self-pay | Admitting: Gastroenterology

## 2022-08-26 VITALS — BP 127/83 | HR 114 | Temp 98.4°F | Resp 16 | Ht 65.0 in | Wt 184.4 lb

## 2022-08-26 DIAGNOSIS — D125 Benign neoplasm of sigmoid colon: Secondary | ICD-10-CM

## 2022-08-26 DIAGNOSIS — D128 Benign neoplasm of rectum: Secondary | ICD-10-CM | POA: Diagnosis not present

## 2022-08-26 DIAGNOSIS — Z1211 Encounter for screening for malignant neoplasm of colon: Secondary | ICD-10-CM | POA: Diagnosis not present

## 2022-08-26 DIAGNOSIS — Z85038 Personal history of other malignant neoplasm of large intestine: Secondary | ICD-10-CM

## 2022-08-26 DIAGNOSIS — Z86010 Personal history of colonic polyps: Secondary | ICD-10-CM

## 2022-08-26 DIAGNOSIS — Z08 Encounter for follow-up examination after completed treatment for malignant neoplasm: Secondary | ICD-10-CM

## 2022-08-26 DIAGNOSIS — Z800 Family history of malignant neoplasm of digestive organs: Secondary | ICD-10-CM | POA: Diagnosis not present

## 2022-08-26 MED ORDER — SODIUM CHLORIDE 0.9 % IV SOLN: 500.0000 mL | Freq: Once | INTRAVENOUS | Status: DC

## 2022-08-26 NOTE — Patient Instructions (Signed)
YOU HAD AN ENDOSCOPIC PROCEDURE TODAY AT [facility]:   Refer to the procedure report that was given to you for any specific questions about what was found during the examination.  If the procedure report does not answer your questions, please call your gastroenterologist to clarify.  If you requested that your care partner not be given the details of your procedure findings, then the procedure report has been included in a sealed envelope for you to review at your convenience later.  YOU SHOULD EXPECT: Some feelings of bloating in the abdomen. Passage of more gas than usual.  Walking can help get rid of the air that was put into your GI tract during the procedure and reduce the bloating. If you had a lower endoscopy (such as a colonoscopy or flexible sigmoidoscopy) you may notice spotting of blood in your stool or on the toilet paper. If you underwent a bowel prep for your procedure, you may not have a normal bowel movement for a few days.  Please Note:  You might notice some irritation and congestion in your nose or some drainage.  This is from the oxygen used during your procedure.  There is no need for concern and it should clear up in a day or so.  SYMPTOMS TO REPORT IMMEDIATELY:  Following lower endoscopy (colonoscopy or flexible sigmoidoscopy):  Excessive amounts of blood in the stool  Significant tenderness or worsening of abdominal pains  Swelling of the abdomen that is new, acute  Fever of 100F or higher   For urgent or emergent issues, a gastroenterologist can be reached at any hour by calling [phone]. Do not use MyChart messaging for urgent concerns.    DIET:  We do recommend a soft diet for the rest of today. You may proceed to your regular diet tomorrow.  Drink plenty of fluids but you should avoid anything acidic or spicy  ACTIVITY:  You should plan to take it easy for the rest of today and you should NOT DRIVE or use heavy machinery until tomorrow (because  of the sedation medicines used during the test).    FOLLOW UP: Our staff will call the number listed on your records the next business day following your procedure.  We will call around 7:15- 8:00 am to check on you and address any questions or concerns that you may have regarding the information given to you following your procedure. If we do not reach you, we will leave a message.     If any biopsies were taken you will be contacted by phone or by letter within the next 1-3 weeks.  Please call us at [phone] if you have not heard about the biopsies in 3 weeks.    SIGNATURES/CONFIDENTIALITY: You and/or your care partner have signed paperwork which will be entered into your electronic medical record.  These signatures attest to the fact that that the information above on your After Visit Summary has been reviewed and is understood.  Full responsibility of the confidentiality of this discharge information lies with you and/or your care-partner.

## 2022-08-26 NOTE — Progress Notes (Signed)
1443 HR > 100 with esmolol 25 mg given IV, MD updated, vss

## 2022-08-26 NOTE — Progress Notes (Signed)
1455 HR > 100 with esmolol 25 mg given IV, MD updated, vss

## 2022-08-26 NOTE — Progress Notes (Signed)
Patient admitted to recovery.  CRNA is here and talking to patient and her husband. Patient is breathing well.  Sipping on water per CRNA giving it to her.  Patient instructed to drink the water slowly.  1530  CRNA checked pt, and the doctor told him that the patient could go home.  Patient is sitting up and looks much better.

## 2022-08-26 NOTE — Progress Notes (Signed)
Pt's states no medical or surgical changes since previsit or office visit. 

## 2022-08-26 NOTE — Progress Notes (Signed)
1502 Report given to PACU, vss

## 2022-08-26 NOTE — Op Note (Signed)
[facility] Patient Name: Sydney Goodwin Procedure Date: 08/26/2022 1:58 PM MRN: [id number] Endoscopist: Gerrit Heck , MD Age: 46 Referring MD:  Date of Birth: 1976-06-28 Gender: Female Account #: [id number] Procedure:                Colonoscopy Indications:              High risk colon cancer surveillance: Personal                            history of colon cancer                           Family history notable for maternal grandfather                            (age 53) and mother (age 28) with colon cancer.                           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 Cauterized margin is 2                            mm from carcinoma) and adjacent mucosa injected                            with spot. 4 mm distal rectal polyp resected with                            cold snare (path: Tubular adenoma)                           - 06/04/2021: Sigmoid resection. Pathology negative                            for residual carcinoma. 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 The patient's tolerance of                            previous  anesthesia was also reviewed. The risks                            and benefits of the procedure and the sedation                            options and risks were discussed with the patient.                            All questions were answered, and informed consent                            was obtained. Prior Anticoagulants: The patient has                            taken no previous anticoagulant or antiplatelet                            agents. ASA Grade Assessment: II - A patient with                            mild systemic disease. After reviewing the risks                            and benefits, the patient was deemed in                            satisfactory condition to undergo the procedure.                           After obtaining informed consent, the colonoscope                            was passed under direct vision. Throughout the  procedure, the patient's blood pressure, pulse, and                            oxygen saturations were monitored continuously. The                            CF [id number] #[id number] was introduced through the anus                            and advanced to the the terminal ileum. The                            colonoscopy was performed without difficulty. The                            patient did have a small amount on emesis near the                            end of the case. This was immediately recognized                            and suctioned, then treated with Zofran and                            Robinol. She was tachycardic in teh mid 110's and                            treated with Esmolol. She was treated with                            Albuterol at the conclusion of the case. The                            patient otherwise tolerated the procedure well. The                            quality of the bowel preparation was good. The                            terminal ileum, ileocecal valve, appendiceal                             orifice, and rectum were photographed. Scope In: 2:12:43 PM Scope Out: 2:36:52 PM Scope Withdrawal Time: 0 hours 19 minutes 29 seconds  Total Procedure Duration: 0 hours 24 minutes 9 seconds  Findings:                 The perianal and digital rectal examinations were                            normal.                           A 6 mm polyp was found  in the sigmoid colon. The                            polyp was sessile, and located 30 cm from the anal                            verge. The polyp was removed with a cold snare.                            Resection and retrieval were complete. Estimated                            blood loss was minimal.                           There was evidence of a prior end-to-end                            colo-colonic anastomosis in the sigmoid colon,                            located 25 cm from the anal verge. This was patent                            and was characterized by healthy appearing mucosa.                            The anastomosis was traversed.                           A 5 mm polyp was found in the distal rectum. The                            polyp was sessile. The polyp was removed with a                            cold snare. Resection and retrieval were complete.                            Estimated blood loss was minimal.                           The retroflexed view of the distal rectum and anal                            verge was normal and showed no anal or rectal                            abnormalities.                           The terminal ileum appeared normal. Complications:            No immediate complications. Estimated Blood Loss:  Estimated blood loss was minimal. Impression:               - One 6 mm polyp in the sigmoid colon, removed with                            a cold snare. Resected and retrieved.                           - Patent end-to-end colo-colonic anastomosis,                             characterized by healthy appearing mucosa.                           - One 5 mm polyp in the distal rectum, removed with                            a cold snare. Resected and retrieved.                           - The distal rectum and anal verge are normal on                            retroflexion view.                           - The examined portion of the ileum was normal. Recommendation:           - Patient has a contact number available for                            emergencies. The signs and symptoms of potential                            delayed complications were discussed with the                            patient. Return to normal activities tomorrow.                            Written discharge instructions were provided to the                            patient.                           - Resume previous diet.                           - Continue present medications.                           - Await pathology results.                           - Repeat colonoscopy in 3  years for surveillance,                            or potentially sooner based on pathology results.                           - Return to GI office PRN. Gerrit Heck, MD 08/26/2022 2:49:27 PM

## 2022-08-26 NOTE — Progress Notes (Signed)
1430  Patient experiencing vomiting.  Zofran 4 mg IV given. Robinul 0.2 mg IV given due large amount of secretions upon assessment. Pt experienced laryngeal spasm with jaw thrust. HR > 100 with esmolol 25 mg given IV, MD updated, vss

## 2022-08-26 NOTE — Progress Notes (Signed)
GASTROENTEROLOGY PROCEDURE H&P NOTE   Primary Care Physician: Patient, No Pcp Per    Reason for Procedure:  Colon Cancer surveillance  Plan:    Colonoscopy  Patient is appropriate for endoscopic procedure(s) in the ambulatory ([facility]) setting.  The nature of the procedure, as well as the risks, benefits, and alternatives were carefully and thoroughly reviewed with the patient. Ample time for discussion and questions allowed. The patient understood, was satisfied, and agreed to proceed.     HPI: Sydney Goodwin is a 46 y.o. female who presents for colonoscopy for continued colon cancer surveillance.  No active GI symptoms.   Family history notable for maternal grandfather (age 73) and mother (age 24) with colon cancer.  - 02/14/2021: Index screening colonoscopy notable for 15 mm polyp in the sigmoid colon removed with hot snare (path: Invasive moderately differentiated adenocarcinoma, 0.2 cm arising in tubular adenoma with high-grade dysplasia, carcinoma invades into submucosa for depth of 1 mm.  Cauterized margin is 2 mm from carcinoma) and adjacent mucosa injected with spot.  4 mm distal rectal polyp resected with cold snare (path: Tubular adenoma) - 02/25/2021: CT C/A/P: Isolated indeterminate 7 mm hypoattenuating hepatic dome lesion.  Upper normal size perirectal nodes favored to be reactive.  3 mm posterior right upper lobe pulmonary nodule, most likely benign. - 03/14/2021: MRI abdomen: Benign proteinaceous cyst of the liver - 06/04/2021: Sigmoid resection.  Pathology negative for residual carcinoma.  22 benign lymph nodes - 08/19/2021: CT chest: Stable 4 mm right lung nodule, likely benign.  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 (although Geneticist states this mutation most likely does not explain her personal family history of colon cancer) - 02/26/2022: CT chest:  Stable 4 mm right lower lobe pulmonary nodule, consistent with benign etiology  Past Medical History:  Diagnosis Date   Anxiety    Cancer ([place])    Depression    GERD (gastroesophageal reflux disease)    with pregnancy   Headache    Postpartum care following cesarean delivery (08/24/13) 08/24/2013   Status post C-section 08/24/2013   Varicose veins    Left Leg    Past Surgical History:  Procedure Laterality Date   CESAREAN SECTION     CESAREAN SECTION WITH BILATERAL TUBAL LIGATION Bilateral 08/24/2013   Procedure: REPEAT CESAREAN SECTION WITH BILATERAL TUBAL LIGATION;  Surgeon: Lovenia Kim, MD;  Location: [facility];  Service: Obstetrics;  Laterality: Bilateral;   COLON SURGERY     COLONOSCOPY     DILITATION & CURRETTAGE/HYSTROSCOPY WITH NOVASURE ABLATION N/A 11/25/2017   Procedure: HYSTEROSCOPY;  Surgeon: Brien Few, MD;  Location: [facility];  Service: Gynecology;  Laterality: N/A;   DILITATION & CURRETTAGE/HYSTROSCOPY WITH NOVASURE ABLATION N/A 02/11/2018   Procedure: ULTRASOUND GUIDED DILATATION & CURETTAGE/HYSTEROSCOPY WITH NOVASURE ABLATION;  Surgeon: Brien Few, MD;  Location: [facility];  Service: Gynecology;  Laterality: N/A;   SIGMOIDECTOMY     TONSILLECTOMY      Prior to Admission medications   Medication Sig Start Date End Date Taking? Authorizing Provider  calcium carbonate (TUMS EX) 750 MG chewable tablet Chew 2 tablets by mouth daily as needed for heartburn.   Yes [provider]  venlafaxine XR (EFFEXOR-XR) 150 MG 24 hr capsule Take 150 mg by mouth daily. 04/25/21  Yes [provider]  diphenhydrAMINE (BENADRYL) 25 MG tablet Take 50 mg by mouth at bedtime as needed for itching or  sleep.    [provider]  ibuprofen (ADVIL,MOTRIN) 200 MG tablet Take 400 mg by mouth every 8 (eight) hours as needed for headache or mild pain.     [provider]    Current Outpatient Medications  Medication Sig Dispense Refill   calcium  carbonate (TUMS EX) 750 MG chewable tablet Chew 2 tablets by mouth daily as needed for heartburn.     venlafaxine XR (EFFEXOR-XR) 150 MG 24 hr capsule Take 150 mg by mouth daily.     diphenhydrAMINE (BENADRYL) 25 MG tablet Take 50 mg by mouth at bedtime as needed for itching or sleep.     ibuprofen (ADVIL,MOTRIN) 200 MG tablet Take 400 mg by mouth every 8 (eight) hours as needed for headache or mild pain.      Current Facility-Administered Medications  Medication Dose Route Frequency Provider Last Rate Last Admin   0.9 %  sodium chloride infusion  500 mL Intravenous Once Benjiman Sedgwick V, DO        Allergies as of 08/26/2022 - Review Complete 08/26/2022  Allergen Reaction Noted   Morphine and related Itching and Other (See Comments) 08/16/2013   Hydromorphone Other (See Comments) 08/16/2013   Penicillins Itching, Rash, and Other (See Comments) 01/23/2013    Family History  Problem Relation Age of Onset   Varicose Veins Mother    Colon polyps Mother    Colon cancer Mother 16   Hyperlipidemia Father    Varicose Veins Father    Colon cancer Maternal Grandfather 23   Breast cancer Other 53   Esophageal cancer Neg Hx    Rectal cancer Neg Hx    Stomach cancer Neg Hx     Social History   Socioeconomic History   Marital status: Married    Spouse name: Not on file   Number of children: Not on file   Years of education: Not on file   Highest education level: Not on file  Occupational History   Not on file  Tobacco Use   Smoking status: Never   Smokeless tobacco: Never  Vaping Use   Vaping Use: Never used  Substance and Sexual Activity   Alcohol use: Yes    Comment: occ   Drug use: No   Sexual activity: Yes    Birth control/protection: Surgical  Other Topics Concern   Not on file  Social History Narrative   Not on file   Social Determinants of Health   Financial Resource Strain: Not on file  Food Insecurity: Not on file  Transportation Needs: Not on file  Physical  Activity: Not on file  Stress: Not on file  Social Connections: Not on file  Intimate Partner Violence: Not on file    Physical Exam: Vital signs in last 24 hours: '@BP'$  122/75   Pulse (!) 105   Temp 98.4 F (36.9 C)   Ht '5\' 5"'$  (1.651 m)   Wt 184 lb 6.4 oz (83.6 kg)   SpO2 98%   BMI 30.69 kg/m  GEN: NAD EYE: Sclerae anicteric ENT: MMM CV: Non-tachycardic Pulm: CTA b/l GI: Soft, NT/ND NEURO:  Alert & Oriented x [place], DO [facility]   08/26/2022 1:54 PM

## 2022-08-26 NOTE — Progress Notes (Signed)
1438 Albuterol neb given, vss

## 2022-08-26 NOTE — Progress Notes (Signed)
1450 HR > 100 with esmolol 25 mg given IV, MD updated, vss

## 2022-08-26 NOTE — Progress Notes (Signed)
Called to room to assist during endoscopic procedure.  Patient ID and intended procedure confirmed with present staff. Received instructions for my participation in the procedure from the performing physician.  

## 2022-08-26 NOTE — Progress Notes (Signed)
1500 HR > 100 with esmolol 25 mg given IV, MD updated, vss

## 2022-08-27 ENCOUNTER — Telehealth: Payer: Self-pay | Admitting: *Deleted

## 2022-08-27 NOTE — Telephone Encounter (Signed)
Attempt for follow up phone call. No answer at number given.  Voicemail is full.

## 2022-09-09 DIAGNOSIS — Z124 Encounter for screening for malignant neoplasm of cervix: Secondary | ICD-10-CM | POA: Diagnosis not present

## 2022-10-15 IMAGING — CT CT CHEST-ABD-PELV W/ CM
3 of 5 series · 15 of 36 positions shown, 17 images · IV contrast (APPLIED)
Comparison: 01/26/2013 abdominal ultrasound.

CLINICAL DATA: Cancerous polyp found during colonoscopy.

EXAM:
CT CHEST, ABDOMEN, AND PELVIS WITH CONTRAST
TECHNIQUE: Multidetector CT imaging of the chest, abdomen and pelvis was
performed following the standard protocol during bolus
administration of intravenous contrast.
CONTRAST:  100mL OMNIPAQUE IOHEXOL 300 MG/ML  SOLN

[Series 2: cap with · axial · 0.98mm/px · z∈[-376,+134]mm · 9 of 128 slices shown, 11 images]
[im 13/128  mediastinal]
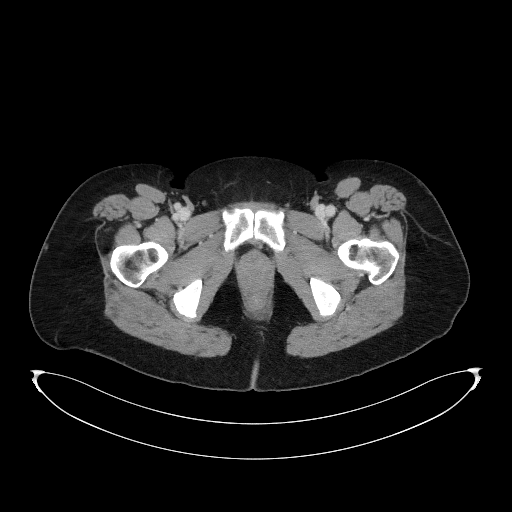
[im 13/128  bone]
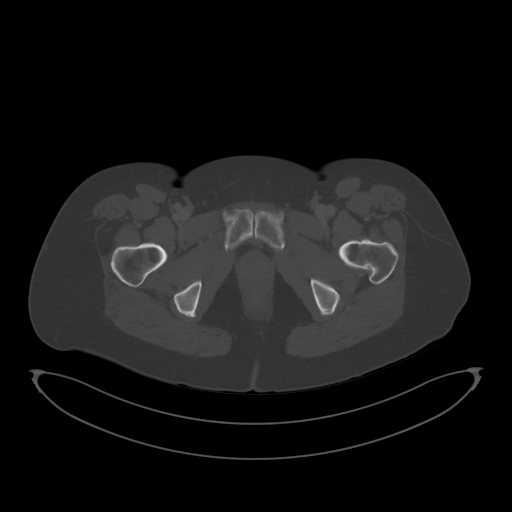
[im 26/128  mediastinal]
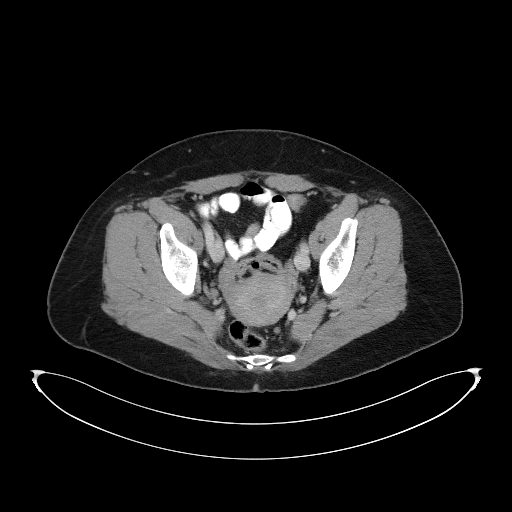
[im 39/128  mediastinal]
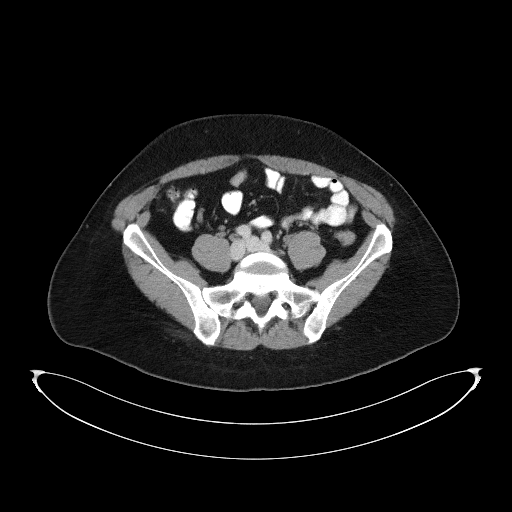
[im 51/128  mediastinal]
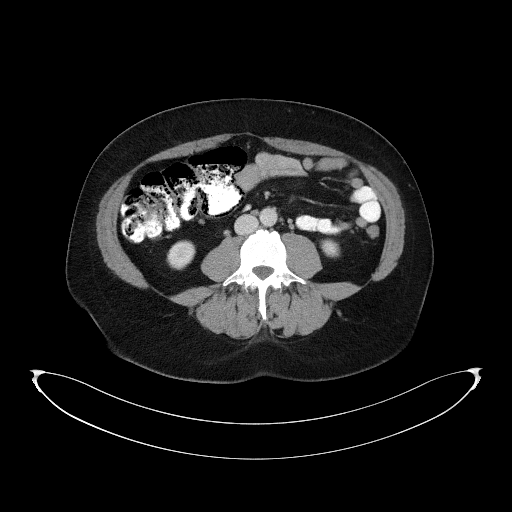
[im 64/128  mediastinal]
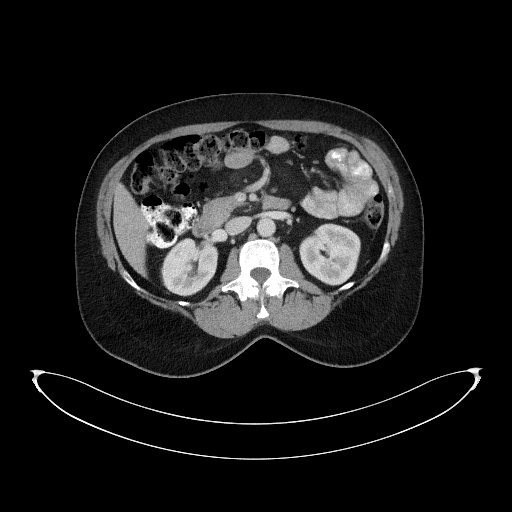
[im 77/128  mediastinal]
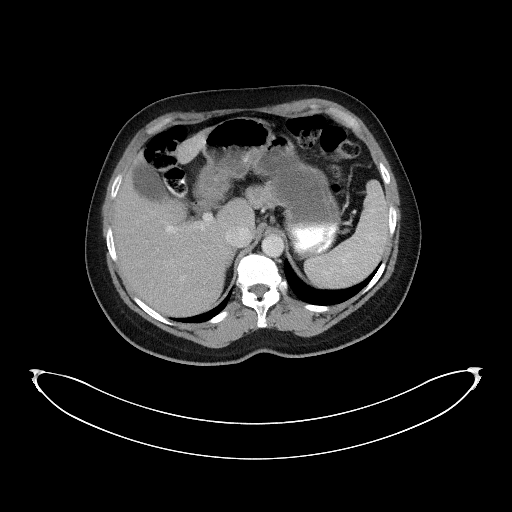
[im 89/128  mediastinal]
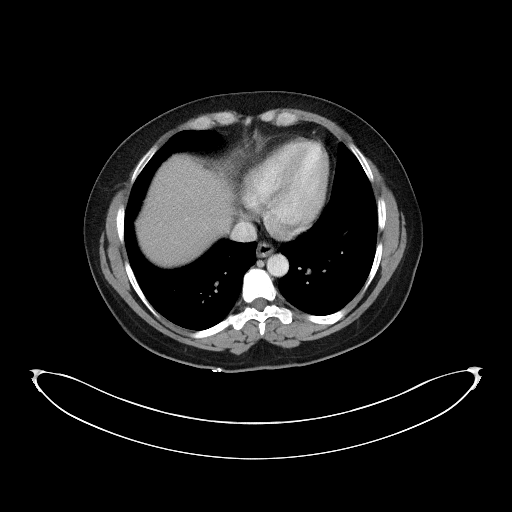
[im 102/128  mediastinal]
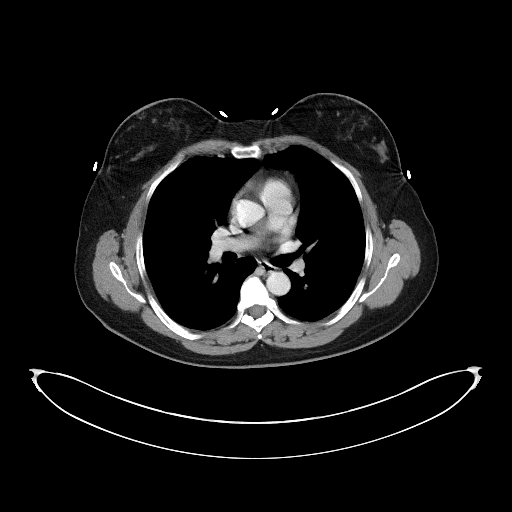
[im 115/128  mediastinal]
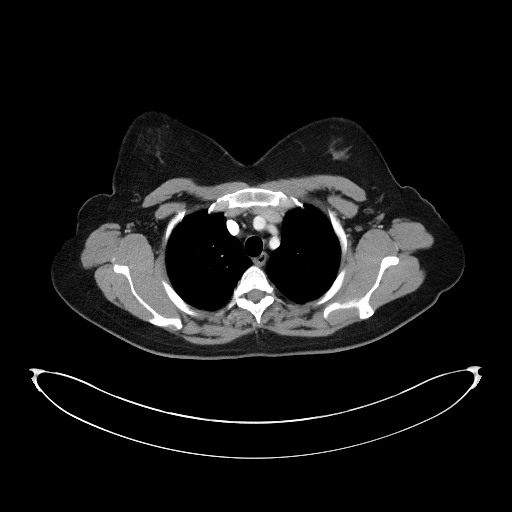
[im 115/128  bone]
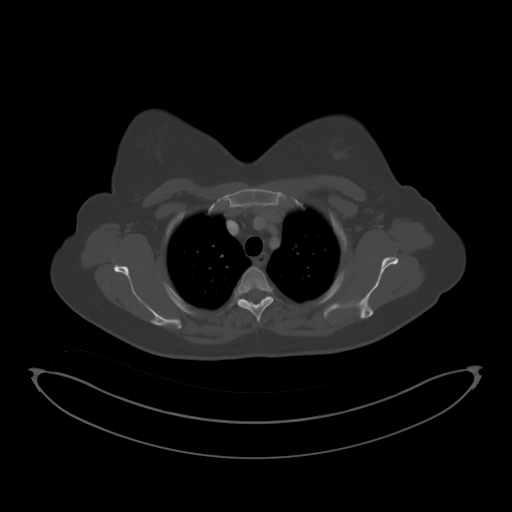

[Series 4: lung · axial · 0.98mm/px · z∈[-80,-10]mm · 3 of 151 slices shown]
[im 12/151  bone]
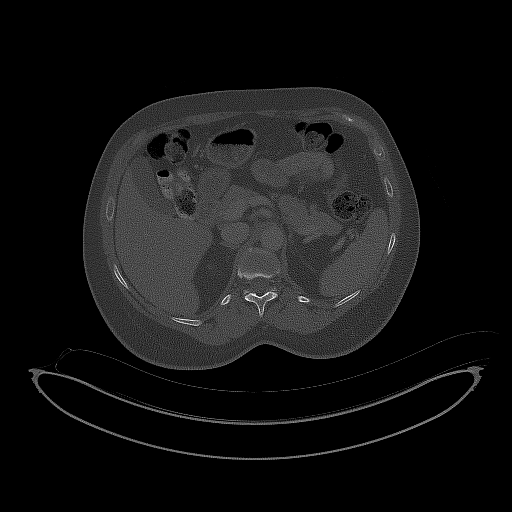
[im 35/151  bone]
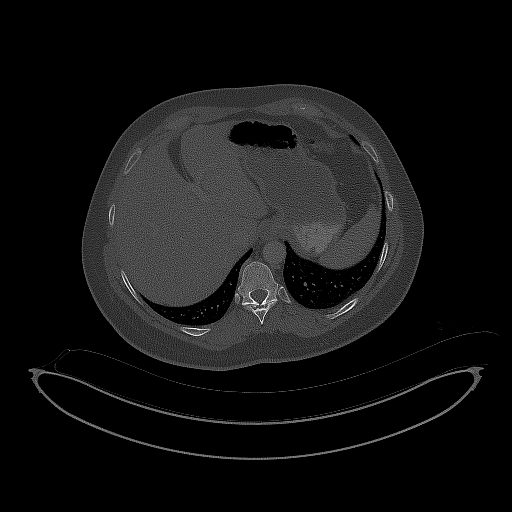
[im 47/151  bone]
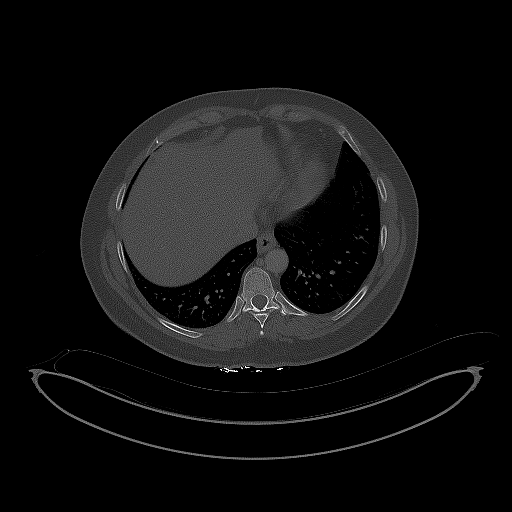

[Series 5: coronals · coronal · 0.92mm/px · 3 of 167 slices shown]
[im 34/167  mediastinal]
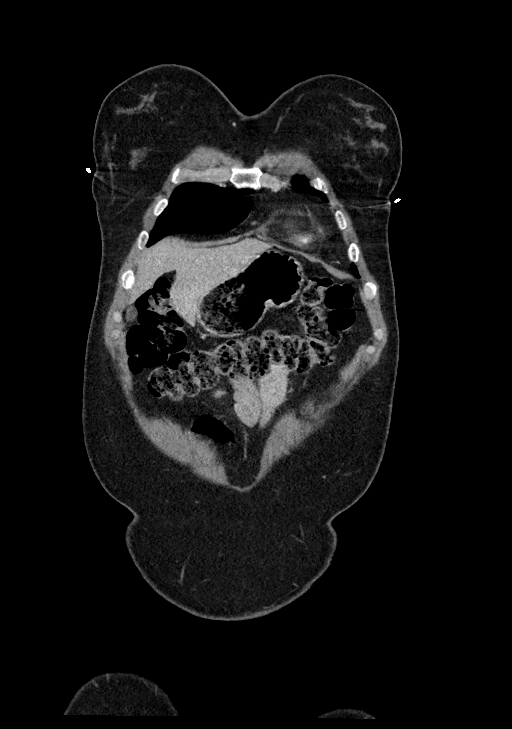
[im 67/167  mediastinal]
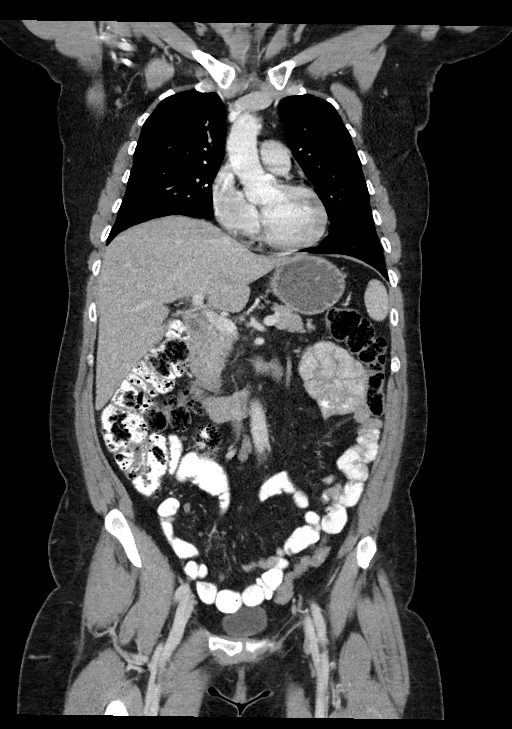
[im 100/167  mediastinal]
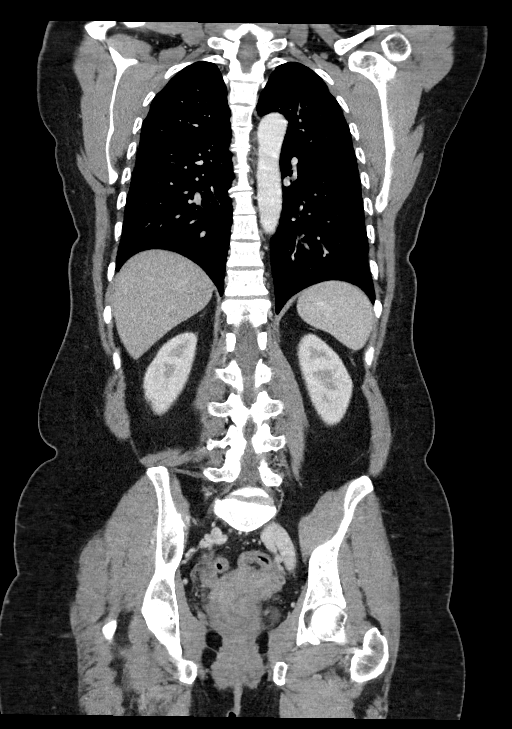

[15 of 36 positions shown; findings below may reference images not displayed]

FINDINGS: CT CHEST FINDINGS

Cardiovascular: Normal aortic caliber. Normal heart size, without
pericardial effusion. No central pulmonary embolism, on this
non-dedicated study.

Mediastinum/Nodes: No supraclavicular adenopathy. No mediastinal or
hilar adenopathy. Tiny hiatal hernia.

Lungs/Pleura: No pleural fluid. 3 mm posterior right upper lobe
pulmonary nodule on 54/4.

Musculoskeletal: No acute osseous abnormality.

CT ABDOMEN PELVIS FINDINGS

Hepatobiliary: Hepatic dome (straddling the anterior right and
medial left hepatic lobes) 7 mm hypoattenuating lesion including on
43/2 and 42/5 coronal. Slightly greater than fluid density.

Normal gallbladder, without biliary ductal dilatation.

Pancreas: Normal, without mass or ductal dilatation.

Spleen: Normal in size, without focal abnormality.

Adrenals/Urinary Tract: Normal adrenal glands. Normal kidneys,
without hydronephrosis. Normal urinary bladder.

Stomach/Bowel: Normal remainder of the stomach. No colonic primary
identified. Normal terminal ileum and appendix. Normal small bowel.

Vascular/Lymphatic: Normal caliber of the aorta and branch vessels.
Small jejunal mesenteric nodes with increased density in the
mesenteric fat, including on 69/2.

No abdominal retroperitoneal or pelvic sidewall adenopathy.

Perirectal nodes including at 3 mm on 108/2 and 4 mm on 103/2 are
upper normal sized.

Reproductive: Retroverted uterus.  No adnexal mass.

Other: No significant free fluid. No evidence of omental or
peritoneal disease.

Musculoskeletal: No acute osseous abnormality.
IMPRESSION: 1. Isolated indeterminate 7 mm hypoattenuating hepatic dome lesion.
Consider further evaluation with abdominal MRI.
2. Upper normal sized perirectal nodes are favored to be reactive.
3. Isolated 3 mm posterior right upper lobe pulmonary nodule, most
likely benign.
4. Increased density within the is jejunal mesenteric fat with small
nodes within. Likely related to mesenteric adenitis/panniculitis. Of
indeterminate clinical significance.

## 2022-10-22 DIAGNOSIS — E669 Obesity, unspecified: Secondary | ICD-10-CM | POA: Diagnosis not present

## 2022-10-22 DIAGNOSIS — Z85038 Personal history of other malignant neoplasm of large intestine: Secondary | ICD-10-CM | POA: Diagnosis not present

## 2022-10-22 DIAGNOSIS — F411 Generalized anxiety disorder: Secondary | ICD-10-CM | POA: Diagnosis not present

## 2022-10-22 DIAGNOSIS — J302 Other seasonal allergic rhinitis: Secondary | ICD-10-CM | POA: Diagnosis not present

## 2022-11-01 IMAGING — MR MR ABDOMEN WO/W CM
18 series · 48 of 48 positions shown · IV contrast (gadavist)
Comparison: CT CAP 02/25/2021

CLINICAL DATA: Evaluate liver lesion identified on recent CT.
Cancerous polyp found during colonoscopy.

EXAM:
MRI ABDOMEN WITHOUT AND WITH CONTRAST
TECHNIQUE: Multiplanar multisequence MR imaging of the abdomen was performed
both before and after the administration of intravenous contrast.
CONTRAST:  8mL GADAVIST GADOBUTROL 1 MMOL/ML IV SOLN

[Series 3: T2 · coronal · 6.0mm · 1.56mm/px · 2 of 35 slices shown (1 of 2)]
[im 1/35]
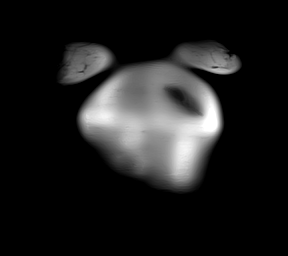
[im 35/35]
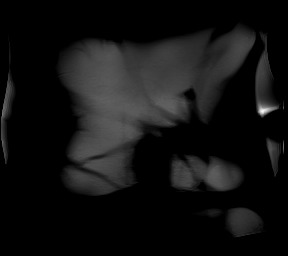

[Series 5: T2 fat-sat · axial · 6.0mm · 1.25mm/px · z∈[-78,+174]mm · 2 of 36 slices shown]
[im 1/36]
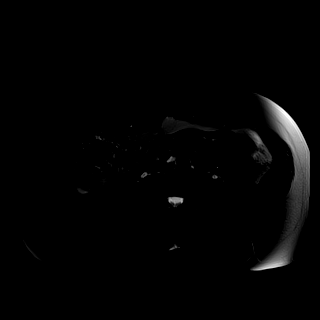
[im 36/36]
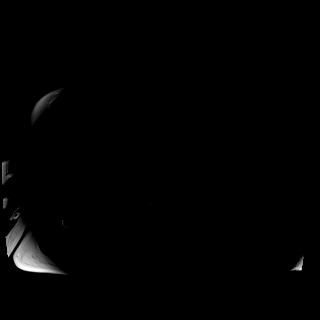

[Series 6: DWI · axial · 6.0mm · 1.49mm/px · z∈[-103,+171]mm · 4 of 78 slices shown (1 of 2)]
[im 1/78]
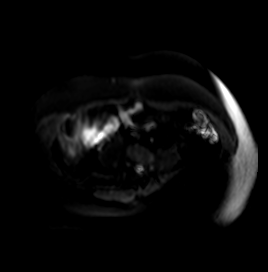
[im 26/78]
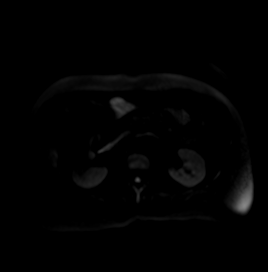
[im 52/78]
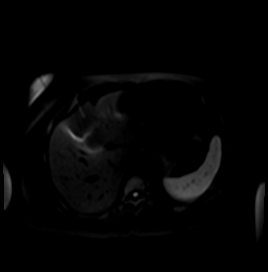
[im 78/78]
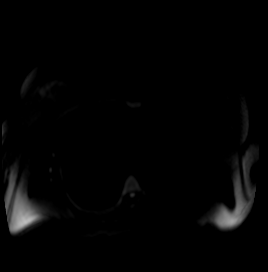

[Series 7: DWI · axial · 6.0mm · 1.49mm/px · z∈[-103,+171]mm · 2 of 39 slices shown (2 of 2)]
[im 1/39]
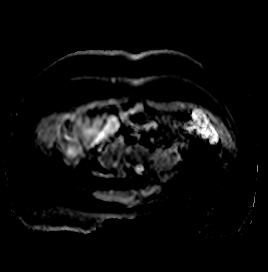
[im 39/39]
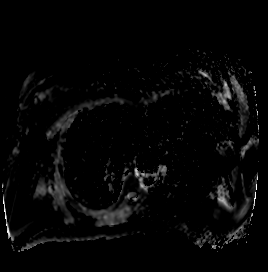

[Series 8: T1 · axial · 3.0mm · 1.25mm/px · z∈[-103,+158]mm · 3 of 88 slices shown (1 of 2)]
[im 1/88]
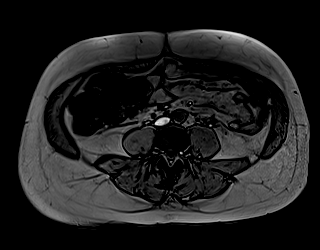
[im 44/88]
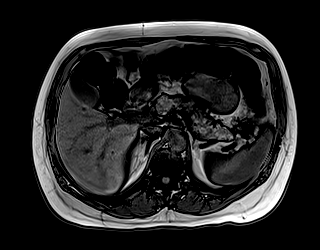
[im 88/88]
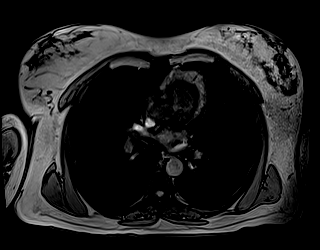

[Series 9: T1 · axial · 3.0mm · 1.25mm/px · z∈[-103,+158]mm · 3 of 88 slices shown (2 of 2)]
[im 1/88]
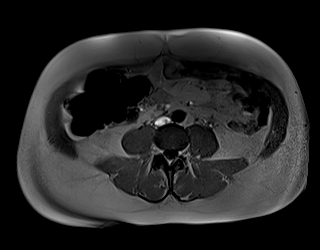
[im 44/88]
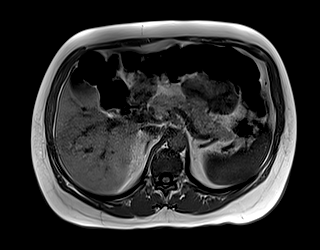
[im 88/88]
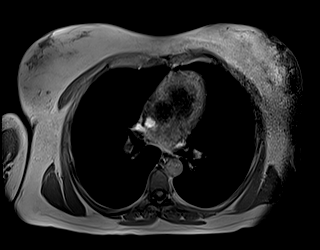

[Series 10: bSSFP · axial · 4.0mm · 0.84mm/px · z∈[-99,+157]mm · 2 of 65 slices shown]
[im 1/65]
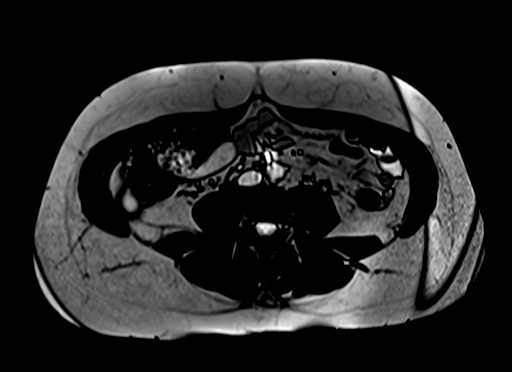
[im 65/65]
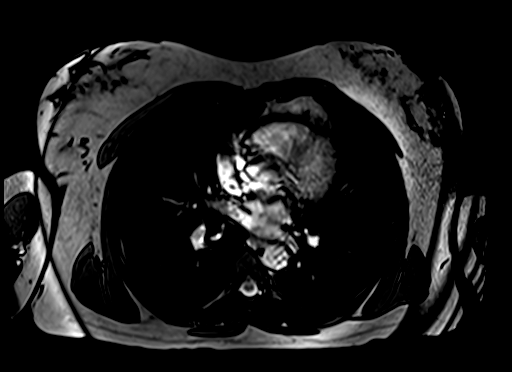

[Series 12: T1 dynamic · axial · 3.0mm · 1.25mm/px · z∈[-121,+164]mm · 3 of 96 slices shown (1 of 10)]
[im 1/96]
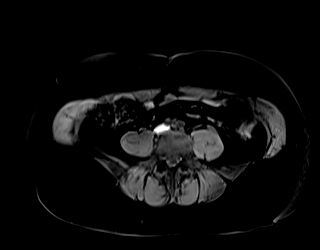
[im 48/96]
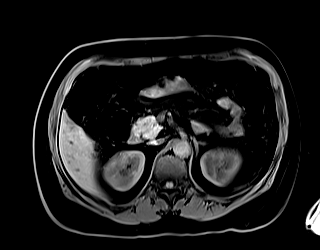
[im 96/96]
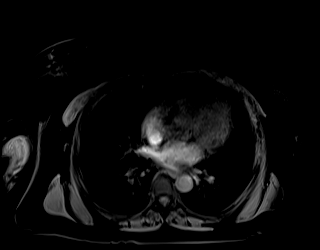

[Series 16: T1 dynamic · axial · 3.0mm · 1.25mm/px · z∈[-121,+164]mm · 3 of 96 slices shown (2 of 10)]
[im 1/96]
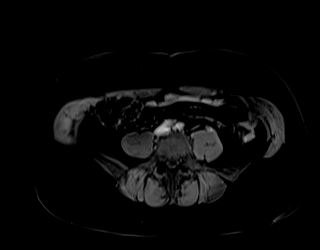
[im 48/96]
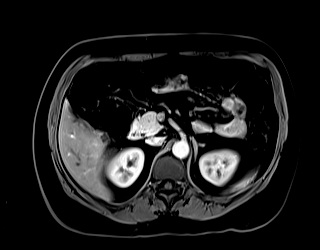
[im 96/96]
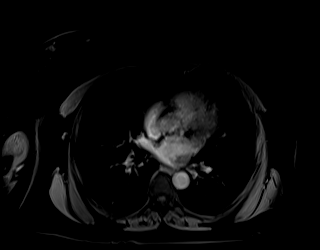

[Series 17: T1 dynamic · axial · 3.0mm · 1.25mm/px · z∈[-121,+164]mm · 3 of 96 slices shown (3 of 10)]
[im 1/96]
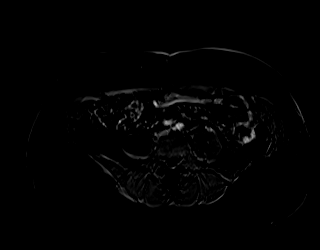
[im 48/96]
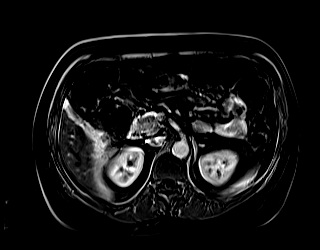
[im 96/96]
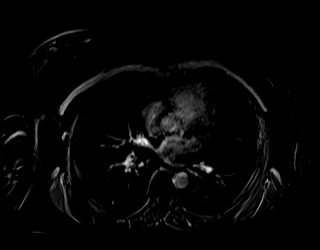

[Series 20: T1 dynamic · axial · 3.0mm · 1.25mm/px · z∈[-121,+164]mm · 3 of 96 slices shown (4 of 10)]
[im 1/96]
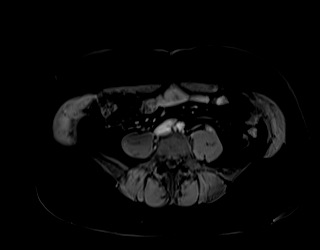
[im 48/96]
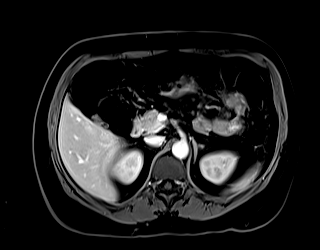
[im 96/96]
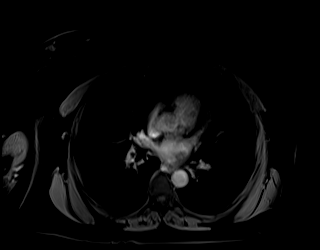

[Series 21: T1 dynamic · axial · 3.0mm · 1.25mm/px · z∈[-121,+164]mm · 3 of 96 slices shown (5 of 10)]
[im 1/96]
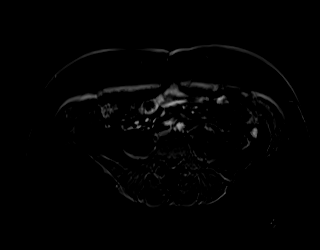
[im 48/96]
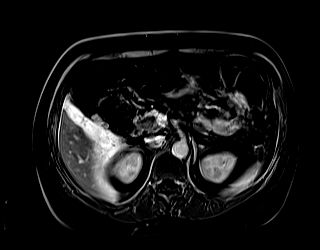
[im 96/96]
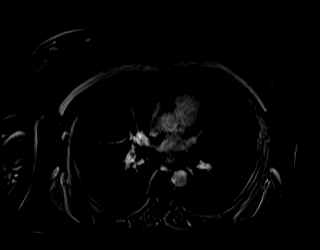

[Series 24: T1 dynamic · axial · 3.0mm · 1.25mm/px · z∈[-121,+164]mm · 3 of 96 slices shown (6 of 10)]
[im 1/96]
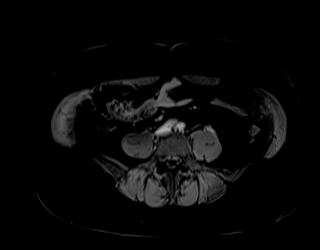
[im 48/96]
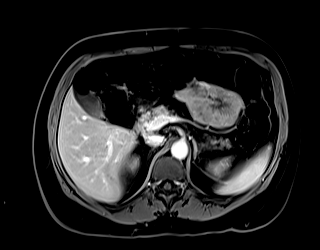
[im 96/96]
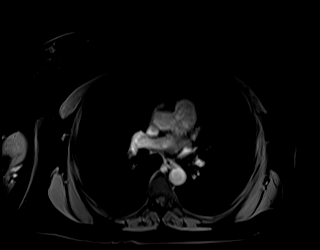

[Series 25: T1 dynamic · axial · 3.0mm · 1.25mm/px · z∈[-121,+164]mm · 3 of 96 slices shown (7 of 10)]
[im 1/96]
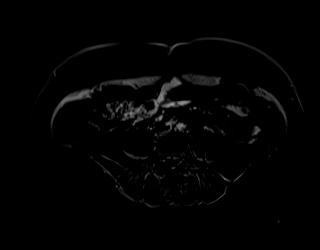
[im 48/96]
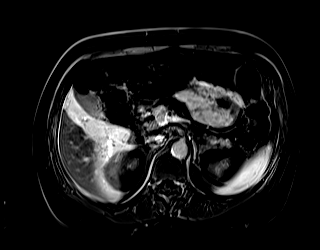
[im 96/96]
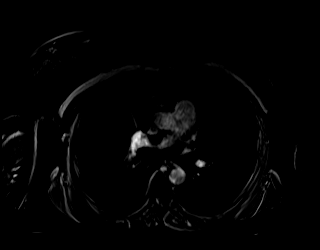

[Series 27: T1 dynamic · coronal · 4.0mm · 1.41mm/px · 2 of 56 slices shown (8 of 10)]
[im 1/56]
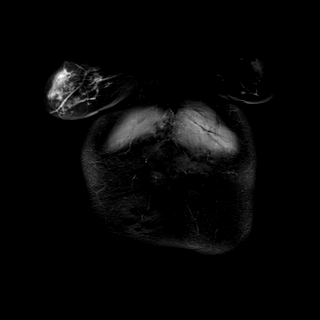
[im 56/56]
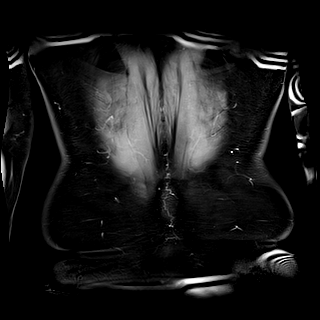

[Series 28: T2 · axial · 6.0mm · 1.56mm/px · 1 of 42 slices shown (2 of 2)]
[im 1/42]
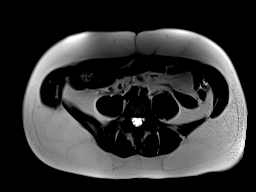

[Series 31: T1 dynamic · axial · 3.0mm · 1.25mm/px · z∈[-121,+164]mm · 3 of 96 slices shown (9 of 10)]
[im 1/96]
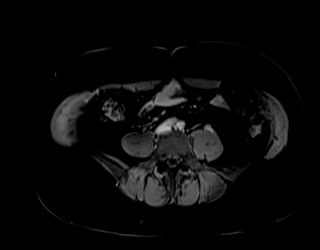
[im 48/96]
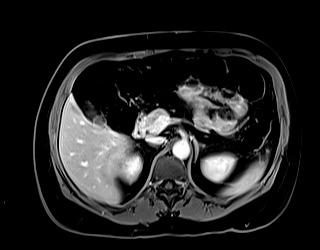
[im 96/96]
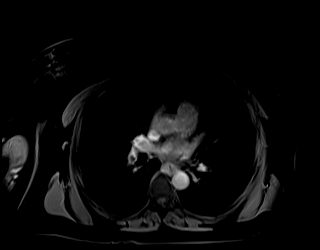

[Series 32: T1 dynamic · axial · 3.0mm · 1.25mm/px · z∈[-121,+164]mm · 3 of 96 slices shown (10 of 10)]
[im 1/96]
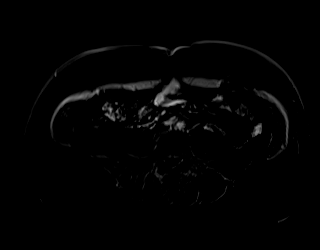
[im 48/96]
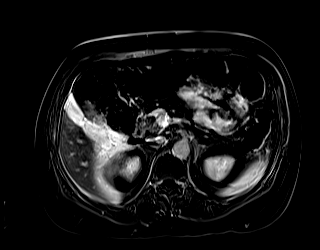
[im 96/96]
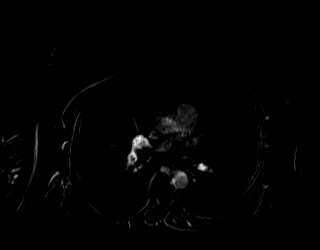

[48 of 48 positions shown; findings below may reference images not displayed]

FINDINGS: Lower chest: No acute findings.

Hepatobiliary: T2 hyperintense structure within the anterior dome of
liver is again noted. This measures approximately 7 mm, image [DATE].
There is mildly heterogeneous T2 hyperintense signal with isointense
T1 signal. On the postcontrast images there is no internal
enhancement.

No additional focal liver abnormalities. Gallbladder is normal. No
bile duct dilatation.

Pancreas: No mass, inflammatory changes, or other parenchymal
abnormality identified.

Spleen:  Within normal limits in size and appearance.

Adrenals/Urinary Tract: Normal appearance of the adrenal glands. No
kidney mass or hydronephrosis identified.

Stomach/Bowel: Visualized portions within the abdomen are
unremarkable.

Vascular/Lymphatic: Patent vasculature. Normal appearance of the
abdominal aorta. No aneurysm. No abdominal adenopathy.

Other:  No free fluid or fluid collections.

Musculoskeletal: No suspicious bone lesions identified.
IMPRESSION: 1. Lesion within the anterior dome of liver has signal
characteristics consistent with a benign proteinaceous cyst. No
suspicious enhancing liver lesions identified.

## 2022-12-21 NOTE — Progress Notes (Unsigned)
Cardiology Office Note:    Date:  12/21/2022   ID:  Sydney Goodwin, DOB June 16, 1976, MRN [id number]  PCP:  Patient, No Pcp Per   [facility] Providers Cardiologist:  None   Referring MD: No ref. provider found    History of Present Illness:    Sydney Goodwin is a 47 y.o. female with a history of colon cancer s/p sigmoidectomy and family history of CAD  who was referred by Dr. Marigene Ehlers for further evaluation of CV risk factors.  Family history notable for father with CABG in 57s. Multiple family members on paternal side with CAD. No history of premature CAD.  Patient previously seen by Dr. Terri Skains. TTE 09/2021 with EF 63%, mild LA dilation, no significant valve disease. Was recommended for Ca score.  Today, ***  Past Medical History:  Diagnosis Date   Anxiety    Cancer ([place])    Depression    GERD (gastroesophageal reflux disease)    with pregnancy   Headache    Postpartum care following cesarean delivery (08/24/13) 08/24/2013   Status post C-section 08/24/2013   Varicose veins    Left Leg    Past Surgical History:  Procedure Laterality Date   CESAREAN SECTION     CESAREAN SECTION WITH BILATERAL TUBAL LIGATION Bilateral 08/24/2013   Procedure: REPEAT CESAREAN SECTION WITH BILATERAL TUBAL LIGATION;  Surgeon: Lovenia Kim, MD;  Location: [facility];  Service: Obstetrics;  Laterality: Bilateral;   COLON SURGERY     COLONOSCOPY     DILITATION & CURRETTAGE/HYSTROSCOPY WITH NOVASURE ABLATION N/A 11/25/2017   Procedure: HYSTEROSCOPY;  Surgeon: Brien Few, MD;  Location: [facility];  Service: Gynecology;  Laterality: N/A;   DILITATION & CURRETTAGE/HYSTROSCOPY WITH NOVASURE ABLATION N/A 02/11/2018   Procedure: ULTRASOUND GUIDED DILATATION & CURETTAGE/HYSTEROSCOPY WITH NOVASURE ABLATION;  Surgeon: Brien Few, MD;  Location: [facility];  Service: Gynecology;  Laterality: N/A;   SIGMOIDECTOMY     TONSILLECTOMY      Current Medications: No outpatient medications  have been marked as taking for the 12/22/22 encounter (Appointment) with [name], MD.     Allergies:   Morphine and related, Hydromorphone, and Penicillins   Social History   Socioeconomic History   Marital status: Married    Spouse name: Not on file   Number of children: Not on file   Years of education: Not on file   Highest education level: Not on file  Occupational History   Not on file  Tobacco Use   Smoking status: Never   Smokeless tobacco: Never  Vaping Use   Vaping Use: Never used  Substance and Sexual Activity   Alcohol use: Yes    Comment: occ   Drug use: No   Sexual activity: Yes    Birth control/protection: Surgical  Other Topics Concern   Not on file  Social History Narrative   Not on file   Social Determinants of Health   Financial Resource Strain: Not on file  Food Insecurity: Not on file  Transportation Needs: Not on file  Physical Activity: Not on file  Stress: Not on file  Social Connections: Not on file     Family History: The patient's ***family history includes Breast cancer (age of onset: 29) in an other family member; Colon cancer (age of onset: 24) in her mother; Colon cancer (age of onset: 67) in her maternal grandfather; Colon polyps in her mother; Hyperlipidemia in her father; Varicose Veins in her father and mother. There is no  history of Esophageal cancer, Rectal cancer, or Stomach cancer.  ROS:   Please see the history of present illness.    *** All other systems reviewed and are negative.  EKGs/Labs/Other Studies Reviewed:    The following studies were reviewed today: TTE 10-05-21: Echocardiogram 05-Oct-2021:  Left ventricle cavity is normal in size. Mild concentric hypertrophy of  the left ventricle. Normal global wall motion. Normal LV systolic function  with EF 62%. Indeterminate diastolic filling pattern.  Left atrial cavity is mildly dilated.  Trace tricuspid regurgitation. Peak RA-RV gradient 17 mmHg.  IVC not  seen to calculate PASP, but pulmonary hypertension unlikely.   EKG:  EKG is *** ordered today.  The ekg ordered today demonstrates ***  Recent Labs: No results found for requested labs within last 365 days.  Recent Lipid Panel No results found for: "CHOL", "TRIG", "HDL", "CHOLHDL", "VLDL", "LDLCALC", "LDLDIRECT"   Risk Assessment/Calculations:   {Does this patient have ATRIAL FIBRILLATION?:[id number]}  No BP recorded.  {Refresh Note OR Click here to enter BP  :1}***         Physical Exam:    VS:  There were no vitals taken for this visit.    Wt Readings from Last 3 Encounters:  08/26/22 184 lb 6.4 oz (83.6 kg)  08/05/22 184 lb 6.4 oz (83.6 kg)  09/08/21 187 lb 3.2 oz (84.9 kg)     GEN: *** Well nourished, well developed in no acute distress HEENT: Normal NECK: No JVD; No carotid bruits LYMPHATICS: No lymphadenopathy CARDIAC: ***RRR, no murmurs, rubs, gallops RESPIRATORY:  Clear to auscultation without rales, wheezing or rhonchi  ABDOMEN: Soft, non-tender, non-distended MUSCULOSKELETAL:  No edema; No deformity  SKIN: Warm and dry NEUROLOGIC:  Alert and oriented x 3 PSYCHIATRIC:  Normal affect   ASSESSMENT:    No diagnosis found. PLAN:    In order of problems listed above:  #Family history of CAD: Father with CABG in his 3s. Multiple family members on paternal side with CAD. Currently, the patient is active and asymptomatic. Will check Ca score *** -Check Ca score  #Systolic Murmur: TTE with normal BiV function, no significant valve disease  #Obesity:  BMI *** -Refer to Pharm D for initiation of Wegovy -Lifestyle modifications as below  Exercise recommendations: Goal of exercising for at least 30 minutes a day, at least 5 times per week.  Please exercise to a moderate exertion.  This means that while exercising it is difficult to speak in full sentences, however you are not so short of breath that you feel you must stop, and not so comfortable that you can  carry on a full conversation.  Exertion level should be approximately a 5/10, if 10 is the most exertion you can perform.  Diet recommendations: Recommend a heart healthy diet such as the Mediterranean diet.  This diet consists of plant based foods, healthy fats, lean meats, olive oil.  It suggests limiting the intake of simple carbohydrates such as white breads, pastries, and pastas.  It also limits the amount of red meat, wine, and dairy products such as cheese that one should consume on a daily basis.       {Are you ordering a CV Procedure (e.g. stress test, cath, DCCV, TEE, etc)?   Press F2        :[id number]}    Medication Adjustments/Labs and Tests Ordered: Current medicines are reviewed at length with the patient today.  Concerns regarding medicines are outlined above.  No orders of the defined types were  placed in this encounter.  No orders of the defined types were placed in this encounter.   There are no Patient Instructions on file for this visit.   Signed, [name], MD  12/21/2022 2:14 PM    [place]

## 2022-12-22 ENCOUNTER — Ambulatory Visit: Payer: BC Managed Care – PPO | Attending: Cardiology | Admitting: Cardiology

## 2022-12-22 ENCOUNTER — Encounter: Payer: Self-pay | Admitting: Cardiology

## 2022-12-22 ENCOUNTER — Telehealth: Payer: Self-pay | Admitting: Pharmacist

## 2022-12-22 ENCOUNTER — Encounter: Payer: Self-pay | Admitting: Pharmacist

## 2022-12-22 VITALS — BP 130/82 | HR 95 | Ht 65.0 in | Wt 188.8 lb

## 2022-12-22 DIAGNOSIS — Z79899 Other long term (current) drug therapy: Secondary | ICD-10-CM | POA: Diagnosis not present

## 2022-12-22 DIAGNOSIS — Z8249 Family history of ischemic heart disease and other diseases of the circulatory system: Secondary | ICD-10-CM | POA: Diagnosis not present

## 2022-12-22 DIAGNOSIS — Z13220 Encounter for screening for lipoid disorders: Secondary | ICD-10-CM | POA: Diagnosis not present

## 2022-12-22 DIAGNOSIS — Z6830 Body mass index (BMI) 30.0-30.9, adult: Secondary | ICD-10-CM

## 2022-12-22 DIAGNOSIS — E6609 Other obesity due to excess calories: Secondary | ICD-10-CM | POA: Diagnosis not present

## 2022-12-22 DIAGNOSIS — E669 Obesity, unspecified: Secondary | ICD-10-CM

## 2022-12-22 DIAGNOSIS — Z6831 Body mass index (BMI) 31.0-31.9, adult: Secondary | ICD-10-CM

## 2022-12-22 NOTE — Patient Instructions (Signed)
Medication Instructions:   Your physician recommends that you continue on your current medications as directed. Please refer to the Current Medication list given to you today.  *If you need a refill on your cardiac medications before your next appointment, please call your pharmacy*   Lab Work:  TODAY--NMR LIPOPROFILE, LIPOPROTEIN A, AND APOLIPOPROTEIN B  If you have labs (blood work) drawn today and your tests are completely normal, you will receive your results only by: [place] (if you have MyChart) OR A paper copy in the mail If you have any lab test that is abnormal or we need to change your treatment, we will call you to review the results.   Testing/Procedures:  CARDIAC CALCIUM SCORE (SELF PAY)    Follow-Up: At [facility], you and your health needs are our priority.  As part of our continuing mission to provide you with exceptional heart care, we have created designated Provider Care Teams.  These Care Teams include your primary Cardiologist (physician) and Advanced Practice Providers (APPs -  Physician Assistants and Nurse Practitioners) who all work together to provide you with the care you need, when you need it.  We recommend signing up for the patient portal called "MyChart".  Sign up information is provided on this After Visit Summary.  MyChart is used to connect with patients for Virtual Visits (Telemedicine).  Patients are able to view lab/test results, encounter notes, upcoming appointments, etc.  Non-urgent messages can be sent to your provider as well.   To learn more about what you can do with MyChart, go to [email or web address].    Your next appointment:   1 year(s)  Provider:   DR. Johney Frame

## 2022-12-22 NOTE — Telephone Encounter (Signed)
Received message from Dr Johney Frame that pt would like to start tirzepatide for weight loss. No DM hx, will submit prior authorization for Zepbound.  We do not have pt's prescription insurance in Epic. There is a blurry photo of her [employer] but it does not look like her prescription benefit card from what I can read on it. I have sent pt MyChart message asking for her prescription insurance info so I can submit PA request for Zepbound.

## 2022-12-22 NOTE — Progress Notes (Signed)
Cardiology Office Note:    Date:  12/22/2022   ID:  Sydney Goodwin, DOB May 16, 1976, MRN [id number]  PCP:  Patient, No Pcp Per   [facility] Providers Cardiologist:  None   Referring MD: No ref. provider found    History of Present Illness:    Sydney Goodwin is a 47 y.o. female with a history of colon cancer s/p sigmoidectomy and family history of CAD  who was referred by Dr. Marigene Ehlers for further evaluation of CV risk factors.  Family history notable for father with CABG in 61s. Multiple family members on paternal side with CAD. No history of premature CAD.  Patient previously seen by Dr. Terri Skains. TTE 09/2021 with EF 63%, mild LA dilation, no significant valve disease. Was recommended for Ca score.  Today, the patient states that she has been doing well recently. She had gained weight after colon cancer, due to necessary changes in her diet which she had difficulty altering after remission. We discussed possible diet and exercise changes as well as GLP agonists. She is very interested in starting a GLP-1 agonist at this time.  Otherwise, she is doing well. She denies any palpitations, chest pain, shortness of breath, or peripheral edema. No lightheadedness, headaches, syncope, orthopnea, or PND. She is interested in pursuing a Ca score due to her family history of CAD.   Past Medical History:  Diagnosis Date   Anxiety    Cancer ([place])    Depression    GERD (gastroesophageal reflux disease)    with pregnancy   Headache    Postpartum care following cesarean delivery (08/24/13) 08/24/2013   Status post C-section 08/24/2013   Varicose veins    Left Leg    Past Surgical History:  Procedure Laterality Date   CESAREAN SECTION     CESAREAN SECTION WITH BILATERAL TUBAL LIGATION Bilateral 08/24/2013   Procedure: REPEAT CESAREAN SECTION WITH BILATERAL TUBAL LIGATION;  Surgeon: Lovenia Kim, MD;  Location: [facility];  Service: Obstetrics;  Laterality: Bilateral;   COLON  SURGERY     COLONOSCOPY     DILITATION & CURRETTAGE/HYSTROSCOPY WITH NOVASURE ABLATION N/A 11/25/2017   Procedure: HYSTEROSCOPY;  Surgeon: Brien Few, MD;  Location: [facility];  Service: Gynecology;  Laterality: N/A;   DILITATION & CURRETTAGE/HYSTROSCOPY WITH NOVASURE ABLATION N/A 02/11/2018   Procedure: ULTRASOUND GUIDED DILATATION & CURETTAGE/HYSTEROSCOPY WITH NOVASURE ABLATION;  Surgeon: Brien Few, MD;  Location: [facility];  Service: Gynecology;  Laterality: N/A;   SIGMOIDECTOMY     TONSILLECTOMY      Current Medications: Current Meds  Medication Sig   calcium carbonate (TUMS EX) 750 MG chewable tablet Chew 2 tablets by mouth daily as needed for heartburn.   ibuprofen (ADVIL,MOTRIN) 200 MG tablet Take 400 mg by mouth every 8 (eight) hours as needed for headache or mild pain.    venlafaxine XR (EFFEXOR-XR) 150 MG 24 hr capsule Take 150 mg by mouth daily.     Allergies:   Morphine and related, Hydromorphone, and Penicillins   Social History   Socioeconomic History   Marital status: Married    Spouse name: Not on file   Number of children: Not on file   Years of education: Not on file   Highest education level: Not on file  Occupational History   Not on file  Tobacco Use   Smoking status: Never   Smokeless tobacco: Never  Vaping Use   Vaping Use: Never used  Substance and Sexual Activity   Alcohol use: Yes  Comment: occ   Drug use: No   Sexual activity: Yes    Birth control/protection: Surgical  Other Topics Concern   Not on file  Social History Narrative   Not on file   Social Determinants of Health   Financial Resource Strain: Not on file  Food Insecurity: Not on file  Transportation Needs: Not on file  Physical Activity: Not on file  Stress: Not on file  Social Connections: Not on file     Family History: The patient's family history includes Breast cancer (age of onset: 60) in an other family member; Colon cancer (age of onset: 38) in her mother;  Colon cancer (age of onset: 50) in her maternal grandfather; Colon polyps in her mother; Hyperlipidemia in her father; Varicose Veins in her father and mother. There is no history of Esophageal cancer, Rectal cancer, or Stomach cancer.  ROS:   Please see the history of present illness.    Review of Systems  Constitutional:  Negative for chills and fever.       (+) Weight Gain  HENT:  Negative for congestion.   Eyes:  Negative for discharge.  Respiratory:  Negative for sputum production.   Cardiovascular:  Negative for chest pain, palpitations, orthopnea, claudication, leg swelling and PND.  Gastrointestinal:  Negative for heartburn, nausea and vomiting.  Genitourinary:  Negative for flank pain.  Musculoskeletal:  Negative for myalgias.  Neurological:  Negative for dizziness and loss of consciousness.  Endo/Heme/Allergies:  Negative for polydipsia.  Psychiatric/Behavioral:  Negative for substance abuse.     All other systems reviewed and are negative.  EKGs/Labs/Other Studies Reviewed:    The following studies were reviewed today: TTE 10-04-21: Echocardiogram 10-04-2021:  Left ventricle cavity is normal in size. Mild concentric hypertrophy of  the left ventricle. Normal global wall motion. Normal LV systolic function  with EF 62%. Indeterminate diastolic filling pattern.  Left atrial cavity is mildly dilated.  Trace tricuspid regurgitation. Peak RA-RV gradient 17 mmHg.  IVC not seen to calculate PASP, but pulmonary hypertension unlikely.   EKG:  The EKG is personally reviewed 12/22/2022: NSR, rate 95 bpm.  Recent Labs: No results found for requested labs within last 365 days.  Recent Lipid Panel No results found for: "CHOL", "TRIG", "HDL", "CHOLHDL", "VLDL", "LDLCALC", "LDLDIRECT"   Risk Assessment/Calculations:                Physical Exam:    VS:  BP 130/82   Pulse 95   Ht '5\' 5"'$  (1.651 m)   Wt 188 lb 12.8 oz (85.6 kg)   SpO2 97%   BMI 31.42 kg/m     Wt  Readings from Last 3 Encounters:  12/22/22 188 lb 12.8 oz (85.6 kg)  08/26/22 184 lb 6.4 oz (83.6 kg)  08/05/22 184 lb 6.4 oz (83.6 kg)     GEN:  Well nourished, well developed in no acute distress HEENT: Normal NECK: No JVD; No carotid bruits CARDIAC: RRR, no murmurs, rubs, gallops RESPIRATORY:  Clear to auscultation without rales, wheezing or rhonchi  ABDOMEN: Soft, non-tender, non-distended MUSCULOSKELETAL:  No edema; No deformity  SKIN: Warm and dry NEUROLOGIC:  Alert and oriented x 3 PSYCHIATRIC:  Normal affect   ASSESSMENT:    1. Family history of early CAD   2. Family history of heart disease   3. Medication management   4. Screening for lipid disorders   5. Class 1 obesity due to excess calories without serious comorbidity with body mass index (BMI) of  31.0 to 31.9 in adult    PLAN:    In order of problems listed above:  #Family history of CAD: Father with CABG in his 54s. Multiple family members on paternal side with CAD. Currently, the patient is active and asymptomatic. Will check Ca score and NMR panel for further risk stratification. -Check Ca score -Check NMR panel, Lp (a), apolipoprotein B  #Obesity:  BMI 31. Has been working on diet and exercise with difficulty losing weight. She will continue to work on lifestyle modifications and will refer to Pharm D for initiation of GLP-1. -Refer to Pharm D for initiation of GLP-1 agonist -Lifestyle modifications as below  Exercise recommendations: Goal of exercising for at least 30 minutes a day, at least 5 times per week.  Please exercise to a moderate exertion.  This means that while exercising it is difficult to speak in full sentences, however you are not so short of breath that you feel you must stop, and not so comfortable that you can carry on a full conversation.  Exertion level should be approximately a 5/10, if 10 is the most exertion you can perform.  Diet recommendations: Recommend a heart healthy diet such  as the Mediterranean diet.  This diet consists of plant based foods, healthy fats, lean meats, olive oil.  It suggests limiting the intake of simple carbohydrates such as white breads, pastries, and pastas.  It also limits the amount of red meat, wine, and dairy products such as cheese that one should consume on a daily basis.          Follow Up: 1 year.   Medication Adjustments/Labs and Tests Ordered: Current medicines are reviewed at length with the patient today.  Concerns regarding medicines are outlined above.  Orders Placed This Encounter  Procedures   CT CARDIAC SCORING (SELF PAY ONLY)   NMR, lipoprofile   Lipoprotein A (LPA)   Apolipoprotein B   EKG 12-Lead   No orders of the defined types were placed in this encounter.  Patient Instructions  Medication Instructions:   Your physician recommends that you continue on your current medications as directed. Please refer to the Current Medication list given to you today.  *If you need a refill on your cardiac medications before your next appointment, please call your pharmacy*   Lab Work:  TODAY--NMR LIPOPROFILE, LIPOPROTEIN A, AND APOLIPOPROTEIN B  If you have labs (blood work) drawn today and your tests are completely normal, you will receive your results only by: [place] (if you have MyChart) OR A paper copy in the mail If you have any lab test that is abnormal or we need to change your treatment, we will call you to review the results.   Testing/Procedures:  CARDIAC CALCIUM SCORE (SELF PAY)    Follow-Up: At [facility], you and your health needs are our priority.  As part of our continuing mission to provide you with exceptional heart care, we have created designated Provider Care Teams.  These Care Teams include your primary Cardiologist (physician) and Advanced Practice Providers (APPs -  Physician Assistants and Nurse Practitioners) who all work together to provide you with the care you need,  when you need it.  We recommend signing up for the patient portal called "MyChart".  Sign up information is provided on this After Visit Summary.  MyChart is used to connect with patients for Virtual Visits (Telemedicine).  Patients are able to view lab/test results, encounter notes, upcoming appointments, etc.  Non-urgent messages can  be sent to your provider as well.   To learn more about what you can do with MyChart, go to [email or web address].    Your next appointment:   1 year(s)  Provider:   DR. Johney Frame     I,Coren O'Brien,acting as a scribe for [name], MD.,have documented all relevant documentation on the behalf of [name], MD,as directed by  [name], MD while in the presence of [name], MD.  I, [name], MD, have reviewed all documentation for this visit. The documentation on 12/22/22 for the exam, diagnosis, procedures, and orders are all accurate and complete.   Signed, [name], MD  12/22/2022 11:05 AM    [place]

## 2022-12-23 LAB — NMR, LIPOPROFILE
Cholesterol, Total: 210 mg/dL — ABNORMAL HIGH (ref 100–199)
HDL Particle Number: 35.2 umol/L (ref >=30.5)
HDL-C: 54 mg/dL (ref >39)
LDL Particle Number: 1455 nmol/L — ABNORMAL HIGH (ref <1000)
LDL Size: 21 nm (ref >20.5)
LDL-C (NIH Calc): 136 mg/dL — ABNORMAL HIGH (ref 0–99)
LP-IR Score: 36 (ref <=45)
Small LDL Particle Number: 736 nmol/L — ABNORMAL HIGH (ref <=527)
Triglycerides: 111 mg/dL (ref 0–149)

## 2022-12-23 LAB — LIPOPROTEIN A (LPA): Lipoprotein (a): 30.1 nmol/L (ref <75.0)

## 2022-12-23 LAB — APOLIPOPROTEIN B: Apolipoprotein B: 98 mg/dL — ABNORMAL HIGH (ref <90)

## 2022-12-23 MED ORDER — ZEPBOUND 2.5 MG/0.5ML ~~LOC~~ SOAJ: 2.5000 mg | SUBCUTANEOUS | 0 refills | Status: DC

## 2022-12-23 NOTE — Telephone Encounter (Signed)
Environmental consultant sent via [employer]. PA for Zepbound submitted and approved through 08/24/23. Rx sent to pharmacy, will plan to schedule visit with PharmD to review injection technique, expected benefits/side effects and how to manage.

## 2022-12-30 ENCOUNTER — Ambulatory Visit (HOSPITAL_COMMUNITY)
Admission: RE | Admit: 2022-12-30 | Discharge: 2022-12-30 | Disposition: A | Discharge status: Home / Self Care | Payer: BC Managed Care – PPO | Source: Ambulatory Visit | Attending: Cardiology | Admitting: Cardiology

## 2022-12-30 ENCOUNTER — Ambulatory Visit: Payer: BC Managed Care – PPO | Attending: Cardiology | Admitting: Pharmacist

## 2022-12-30 VITALS — Wt 186.0 lb

## 2022-12-30 DIAGNOSIS — E669 Obesity, unspecified: Secondary | ICD-10-CM | POA: Diagnosis not present

## 2022-12-30 DIAGNOSIS — Z8249 Family history of ischemic heart disease and other diseases of the circulatory system: Secondary | ICD-10-CM | POA: Insufficient documentation

## 2022-12-30 DIAGNOSIS — Z6830 Body mass index (BMI) 30.0-30.9, adult: Secondary | ICD-10-CM | POA: Diagnosis not present

## 2022-12-30 DIAGNOSIS — Z79899 Other long term (current) drug therapy: Secondary | ICD-10-CM | POA: Insufficient documentation

## 2022-12-30 NOTE — Progress Notes (Signed)
Patient ID: Sydney Goodwin                 DOB: 1976-02-02                    MRN: [id number]     HPI: Sydney Goodwin is a 47 y.o. female patient referred to pharmacy clinic by Dr. Johney Frame to initiate weight loss therapy with GLP1-RA. PMH is significant for obesity, colon cancer s/p sigmoidectomy in 2022 and family hx of CAD. Most recent BMI 30.9.  Patient started gaining weight after she had colon cancer. She has done a lot of research on GLP-1 and is well informed. Eats relatively healthy but does admit she eats a little too much read meat. Like bread, but does buy whole grain/seeded bread. No sugary beverages. She has plans to increase her exercise. She will start to lift weights, does not want to loose lean body mass.   Baseline weight and BMI: 186lb/30.95  Diet:  Breakfast: none Lunch: sandwich or salad, daves bread, left overs Dinner: pasta, meatloaf w/ veggies  Drink: coffee w/ 1/2 and 1/2, unsweet tea, seltzer water  Exercise: admits she needs to ramp this up. Plans to start lifting weights  Family History:  Family History  Problem Relation Age of Onset   Varicose Veins Mother    Colon polyps Mother    Colon cancer Mother 4   Hyperlipidemia Father    Varicose Veins Father    Colon cancer Maternal Grandfather 53   Breast cancer Other 80   Esophageal cancer Neg Hx    Rectal cancer Neg Hx    Stomach cancer Neg Hx      Social History: 2 drinks per week, no tobacco  Labs: No results found for: "HGBA1C"  Wt Readings from Last 1 Encounters:  12/22/22 188 lb 12.8 oz (85.6 kg)    BP Readings from Last 1 Encounters:  12/22/22 130/82   Pulse Readings from Last 1 Encounters:  12/22/22 95    No results found for: "CHOL", "TRIG", "HDL", "CHOLHDL", "VLDL", "[place]", "LDLDIRECT"  Past Medical History:  Diagnosis Date   Anxiety    Cancer ([place])    Depression    GERD (gastroesophageal reflux disease)    with pregnancy   Headache    Postpartum care following  cesarean delivery (08/24/13) 08/24/2013   Status post C-section 08/24/2013   Varicose veins    Left Leg    Current Outpatient Medications on File Prior to Visit  Medication Sig Dispense Refill   calcium carbonate (TUMS EX) 750 MG chewable tablet Chew 2 tablets by mouth daily as needed for heartburn.     diphenhydrAMINE (BENADRYL) 25 MG tablet Take 50 mg by mouth at bedtime as needed for itching or sleep.     ibuprofen (ADVIL,MOTRIN) 200 MG tablet Take 400 mg by mouth every 8 (eight) hours as needed for headache or mild pain.      tirzepatide (ZEPBOUND) 2.5 MG/0.5ML Pen Inject 2.5 mg into the skin once a week. 2 mL 0   venlafaxine XR (EFFEXOR-XR) 150 MG 24 hr capsule Take 150 mg by mouth daily.     No current facility-administered medications on file prior to visit.    Allergies  Allergen Reactions   Morphine And Related Itching and Other (See Comments)    Itching from past surgeries.   Hydromorphone Other (See Comments)    Itching from past surgeries.   Penicillins Itching, Rash and Other (See Comments)  Has patient had a PCN reaction causing immediate rash, facial/tongue/throat swelling, SOB or lightheadedness with hypotension: Yes Has patient had a PCN reaction causing severe rash involving mucus membranes or skin necrosis: No Has patient had a PCN reaction that required hospitalization No Has patient had a PCN reaction occurring within the last 10 years: No If all of the above answers are "NO", then may proceed with Cephalosporin use.      Assessment/Plan:  1. Weight loss - Patient has not met goal of at least 5% of body weight loss with comprehensive lifestyle modifications alone in the past 3-6 months. Pharmacotherapy is appropriate to pursue as augmentation. Will start Zepbound 2.'5mg'$  weekly. Confirmed patient not pregnant and no personal or family history of medullary thyroid carcinoma (MTC) or Multiple Endocrine Neoplasia syndrome type 2 (MEN 2). Injection technique  reviewed at today's visit.  Advised patient on common side effects including nausea, diarrhea, dyspepsia, decreased appetite, and fatigue. Counseled patient on reducing meal size and how to titrate medication to minimize side effects. Counseled patient to call if intolerable side effects or if experiencing dehydration, abdominal pain, or dizziness. Patient will adhere to dietary modifications and will target at least 150 minutes of moderate intensity exercise weekly.   Titration Plan:  Will plan to follow the titration plan as below, pending patient is tolerating each dose before increasing to the next. Can slow titration if needed for tolerability.    -Month 1: Inject 2.'5mg'$  SQ once weekly x 4 weeks -Month 2: Inject '5mg'$  SQ once weekly x 4 weeks -Month 3: Inject 7.'5mg'$  SQ once weekly x 4 weeks -Month 4: Inject '10mg'$  SQ once weekly x 4 weeks -Month 5: Inject 12.'5mg'$  SQ once weekly x 4 weeks -Month 6+: Inject '15mg'$  SQ once weekly x 4 weeks   Follow up in 1 month via mychart.  Thank you,  Ramond Dial, Pharm.D, BCPS, CPP [facility] [address] [place], [place] [address]  Phone: [phone]; Fax: [phone]

## 2023-01-07 MED ORDER — ZEPBOUND 5 MG/0.5ML ~~LOC~~ SOAJ: 5.0000 mg | SUBCUTANEOUS | 0 refills | Status: DC

## 2023-01-11 MED ORDER — ZEPBOUND 2.5 MG/0.5ML ~~LOC~~ SOAJ: 2.5000 mg | SUBCUTANEOUS | 1 refills | Status: DC

## 2023-01-13 MED ORDER — ZEPBOUND 2.5 MG/0.5ML ~~LOC~~ SOAJ: 2.5000 mg | SUBCUTANEOUS | 1 refills | Status: DC

## 2023-01-13 NOTE — Addendum Note (Signed)
Addended by: Marcelle Overlie D on: 01/13/2023 07:17 AM   Modules accepted: Orders

## 2023-01-19 MED ORDER — ZEPBOUND 5 MG/0.5ML ~~LOC~~ SOAJ: 5.0000 mg | SUBCUTANEOUS | 1 refills | Status: DC

## 2023-01-19 NOTE — Addendum Note (Signed)
Addended by: Marcelle Overlie D on: 01/19/2023 04:45 PM   Modules accepted: Orders

## 2023-01-27 DIAGNOSIS — Z0000 Encounter for general adult medical examination without abnormal findings: Secondary | ICD-10-CM | POA: Diagnosis not present

## 2023-01-27 DIAGNOSIS — K219 Gastro-esophageal reflux disease without esophagitis: Secondary | ICD-10-CM | POA: Diagnosis not present

## 2023-01-27 DIAGNOSIS — D649 Anemia, unspecified: Secondary | ICD-10-CM | POA: Diagnosis not present

## 2023-01-27 DIAGNOSIS — N951 Menopausal and female climacteric states: Secondary | ICD-10-CM | POA: Diagnosis not present

## 2023-01-27 DIAGNOSIS — Z85038 Personal history of other malignant neoplasm of large intestine: Secondary | ICD-10-CM | POA: Diagnosis not present

## 2023-02-08 ENCOUNTER — Other Ambulatory Visit: Payer: Self-pay | Admitting: *Deleted

## 2023-02-08 DIAGNOSIS — I8393 Asymptomatic varicose veins of bilateral lower extremities: Secondary | ICD-10-CM

## 2023-02-19 ENCOUNTER — Ambulatory Visit (HOSPITAL_COMMUNITY): Payer: BC Managed Care – PPO

## 2023-02-22 MED ORDER — ZEPBOUND 5 MG/0.5ML ~~LOC~~ SOAJ: 5.0000 mg | SUBCUTANEOUS | 0 refills | Status: DC

## 2023-02-22 NOTE — Addendum Note (Signed)
Addended by: Marcelle Overlie D on: 02/22/2023 08:16 AM   Modules accepted: Orders

## 2023-02-22 NOTE — Addendum Note (Signed)
Addended by: Marcelle Overlie D on: 02/22/2023 01:50 PM   Modules accepted: Orders

## 2023-03-05 MED ORDER — ZEPBOUND 5 MG/0.5ML ~~LOC~~ SOAJ: 5.0000 mg | SUBCUTANEOUS | 0 refills | Status: DC

## 2023-03-05 NOTE — Addendum Note (Signed)
Addended by: Jeancarlos Marchena E on: 03/05/2023 09:19 AM   Modules accepted: Orders

## 2023-03-15 ENCOUNTER — Ambulatory Visit (HOSPITAL_COMMUNITY)
Admission: RE | Admit: 2023-03-15 | Discharge: 2023-03-15 | Disposition: A | Discharge status: Home / Self Care | Payer: BC Managed Care – PPO | Source: Ambulatory Visit | Attending: Physician Assistant | Admitting: Physician Assistant

## 2023-03-15 ENCOUNTER — Ambulatory Visit: Payer: BC Managed Care – PPO | Admitting: Physician Assistant

## 2023-03-15 ENCOUNTER — Encounter: Payer: Self-pay | Admitting: Physician Assistant

## 2023-03-15 VITALS — BP 115/82 | HR 91 | Temp 97.8°F | Resp 16 | Ht 65.0 in | Wt 165.0 lb

## 2023-03-15 DIAGNOSIS — I8393 Asymptomatic varicose veins of bilateral lower extremities: Secondary | ICD-10-CM | POA: Insufficient documentation

## 2023-03-15 DIAGNOSIS — I872 Venous insufficiency (chronic) (peripheral): Secondary | ICD-10-CM | POA: Diagnosis not present

## 2023-03-15 NOTE — Progress Notes (Signed)
Requested by:  Dara Lords, PA-C [address] [place],  [place] [address]  Reason for consultation: varicose veins RLE    History of Present Illness   Sydney Goodwin is a 47 y.o. (09/09/1976) female who presents for evaluation of varicose veins of her right lower extremity. She was previously seen by Dr. Myra Gianotti in 2015 for left lower extremity varicose veins. At that time she was having itching, tenderness, and pain on walking. She did not have any significant reflux on duplex exam at that time but was recommended for sclerotherapy treatment. She explains that she never went through with getting the sclerotherapy. She explains that really her left leg is the one that continues to give her trouble. She still has varicose veins behind left knee that are itchy, sting and also achy at times. She does have some discomfort on right as well. Her legs are most painful after she is up on them for prolonged periods of time. She has some spider veins present on both legs that she would like treated. She does not regularly elevate and has not worn compression stockings. She does not have any history of DVT but does have family history of venous disease in her mother.   Venous symptoms include: aching, burning, itching, swelling around varicose veins Onset/duration:  > 10 years  Aggravating factors: sitting, standing Alleviating factors: none Compression:  no Helps:  n/a Pain medications:  none Previous vein procedures:  none History of DVT:  no  Past Medical History:  Diagnosis Date   Anxiety    Cancer    Depression    GERD (gastroesophageal reflux disease)    with pregnancy   Headache    Postpartum care following cesarean delivery (08/24/13) 08/24/2013   Status post C-section 08/24/2013   Varicose veins    Left Leg    Past Surgical History:  Procedure Laterality Date   CESAREAN SECTION     CESAREAN SECTION WITH BILATERAL TUBAL LIGATION Bilateral 08/24/2013   Procedure: REPEAT  CESAREAN SECTION WITH BILATERAL TUBAL LIGATION;  Surgeon: Lenoard Aden, MD;  Location: [facility];  Service: Obstetrics;  Laterality: Bilateral;   COLON SURGERY     COLONOSCOPY     DILITATION & CURRETTAGE/HYSTROSCOPY WITH NOVASURE ABLATION N/A 11/25/2017   Procedure: HYSTEROSCOPY;  Surgeon: Olivia Mackie, MD;  Location: [facility];  Service: Gynecology;  Laterality: N/A;   DILITATION & CURRETTAGE/HYSTROSCOPY WITH NOVASURE ABLATION N/A 02/11/2018   Procedure: ULTRASOUND GUIDED DILATATION & CURETTAGE/HYSTEROSCOPY WITH NOVASURE ABLATION;  Surgeon: Olivia Mackie, MD;  Location: [facility];  Service: Gynecology;  Laterality: N/A;   SIGMOIDECTOMY     TONSILLECTOMY      Social History   Socioeconomic History   Marital status: Married    Spouse name: Not on file   Number of children: Not on file   Years of education: Not on file   Highest education level: Not on file  Occupational History   Not on file  Tobacco Use   Smoking status: Never   Smokeless tobacco: Never  Vaping Use   Vaping Use: Never used  Substance and Sexual Activity   Alcohol use: Yes    Comment: occ   Drug use: No   Sexual activity: Yes    Birth control/protection: Surgical  Other Topics Concern   Not on file  Social History Narrative   Not on file   Social Determinants of Health   Financial Resource Strain: Not on file  Food Insecurity: Not on file  Transportation Needs: Not on file  Physical Activity: Not on file  Stress: Not on file  Social Connections: Not on file  Intimate Partner Violence: Not on file    Family History  Problem Relation Age of Onset   Varicose Veins Mother    Colon polyps Mother    Colon cancer Mother 73   Hyperlipidemia Father    Varicose Veins Father    Colon cancer Maternal Grandfather 2   Breast cancer Other 109   Esophageal cancer Neg Hx    Rectal cancer Neg Hx    Stomach cancer Neg Hx     Current Outpatient Medications  Medication Sig Dispense Refill   calcium carbonate  (TUMS EX) 750 MG chewable tablet Chew 2 tablets by mouth daily as needed for heartburn.     diphenhydrAMINE (BENADRYL) 25 MG tablet Take 50 mg by mouth at bedtime as needed for itching or sleep.     ibuprofen (ADVIL,MOTRIN) 200 MG tablet Take 400 mg by mouth every 8 (eight) hours as needed for headache or mild pain.      tirzepatide (ZEPBOUND) 5 MG/0.5ML Pen Inject 5 mg into the skin once a week. ICD10 code E66.9 2 mL 0   venlafaxine XR (EFFEXOR-XR) 150 MG 24 hr capsule Take 150 mg by mouth daily.     No current facility-administered medications for this visit.    Allergies  Allergen Reactions   Morphine And Related Itching and Other (See Comments)    Itching from past surgeries.   Hydromorphone Other (See Comments)    Itching from past surgeries.   Penicillins Itching, Rash and Other (See Comments)    Has patient had a PCN reaction causing immediate rash, facial/tongue/throat swelling, SOB or lightheadedness with hypotension: Yes Has patient had a PCN reaction causing severe rash involving mucus membranes or skin necrosis: No Has patient had a PCN reaction that required hospitalization No Has patient had a PCN reaction occurring within the last 10 years: No If all of the above answers are "NO", then may proceed with Cephalosporin use.     REVIEW OF SYSTEMS (negative unless checked):   Cardiac:  []  Chest pain or chest pressure? []  Shortness of breath upon activity? []  Shortness of breath when lying flat? []  Irregular heart rhythm?  Vascular:  []  Pain in calf, thigh, or hip brought on by walking? []  Pain in feet at night that wakes you up from your sleep? []  Blood clot in your veins? []  Leg swelling?  Pulmonary:  []  Oxygen at home? []  Productive cough? []  Wheezing?  Neurologic:  []  Sudden weakness in arms or legs? []  Sudden numbness in arms or legs? []  Sudden onset of difficult speaking or slurred speech? []  Temporary loss of vision in one eye? []  Problems with  dizziness?  Gastrointestinal:  []  Blood in stool? []  Vomited blood?  Genitourinary:  []  Burning when urinating? []  Blood in urine?  Psychiatric:  []  Major depression  Hematologic:  []  Bleeding problems? []  Problems with blood clotting?  Dermatologic:  []  Rashes or ulcers?  Constitutional:  []  Fever or chills?  Ear/Nose/Throat:  []  Change in hearing? []  Nose bleeds? []  Sore throat?  Musculoskeletal:  []  Back pain? []  Joint pain? []  Muscle pain?   Physical Examination     Vitals:   03/15/23 1343  BP: 115/82  Pulse: 91  Resp: 16  Temp: 97.8 F (36.6 C)  TempSrc: Temporal  SpO2: 99%  Weight: 165 lb (74.8 kg)  Height: 5\' 5"  (1.651 m)  Body mass index is 27.46 kg/m.  General:  WDWN in NAD; vital signs documented above Gait: Normal HENT: WNL, normocephalic Pulmonary: normal non-labored breathing , without wheezing Cardiac: regular HR Abdomen: soft, NT, no masses Vascular Exam/Pulses: Extremities: with varicose veins left popliteal fossa and lateral left knee and posterior distal thigh, with reticular veins of left lateral knee, spider veins of left thigh, left leg, right thigh, right leg, without edema, without stasis pigmentation, without lipodermatosclerosis, without ulcers Musculoskeletal: no muscle wasting or atrophy  Neurologic: A&O X 3;  No focal weakness or paresthesias are detected Psychiatric:  The pt has Normal affect.  Non-invasive Vascular Imaging   BLE Venous Insufficiency Duplex (03/15/23):  RLE:  No DVT and SVT,  [facility]SV diameter 0. 28-0.71 in proximal thigh No SSV reflux  No deep venous reflux AASV that is incompetent    Medical Decision Making   Mickel BaasLee Karen Salvetti is a 47 y.o. female who presents with: RLE chronic venous insufficiency with varicose veins  Duplex today shows no DVT or SVT. No deep reflux. No SSV reflux. She does have some reflux in junction of GSV and also in the AASV. Based on today's duplex however  she would not be a candidate for a lazer ablation. We looked at the right leg today but her more symptomatic leg is actually her left lower extremity which was not evaluated. I will have her return in near future for repeat duplex of the left lower extremity.  Based on the patient's history and examination, I recommend: daily elevation of her legs above level of her heart, knee high compression stockings, exercise, refraining from prolonged sitting or standing. She was measured and purchased pair of 15-20 mmHg knee high Discussed that she would benefit from sclerotherapy and she is willing to proceed at this time Provided her with Cherene JulianNancy Foreman RN's contact information to call and schedule sclerotherapy treatment of her lower extremities. She understands this is cosmetic and out of pocket She will return for duplex evaluation of her LLE and I will call her to discuss results   Graceann Congressorrina Isiac Breighner, PA-C [facility] [place] Office: [phone]  03/15/2023, 2:10 PM  Clinic MD: Myra GianottiBrabham

## 2023-03-30 MED ORDER — ZEPBOUND 5 MG/0.5ML ~~LOC~~ SOAJ: 5.0000 mg | SUBCUTANEOUS | 0 refills | Status: DC

## 2023-03-30 NOTE — Addendum Note (Signed)
Addended by: Malena Peer D on: 03/30/2023 11:55 AM   Modules accepted: Orders

## 2023-04-06 ENCOUNTER — Ambulatory Visit (INDEPENDENT_AMBULATORY_CARE_PROVIDER_SITE_OTHER): Payer: Self-pay

## 2023-04-06 DIAGNOSIS — I8393 Asymptomatic varicose veins of bilateral lower extremities: Secondary | ICD-10-CM

## 2023-04-06 NOTE — Progress Notes (Signed)
Treated pt's BLE spider and small reticular veins with Asclera 1% administered with a 27g butterfly.  Patient received a total of 8 mL. Pt was a bit jumpy and anxious but tolerated well. She had a large amount of spider veins and is aware further treatments will likely be needed, depending on desired results. Pt was given post treatment care instructions on handout and verbally. She will call if she has any questions/concerns.    Photos: Yes.    Compression stockings applied: Yes.

## 2023-04-15 ENCOUNTER — Other Ambulatory Visit (HOSPITAL_BASED_OUTPATIENT_CLINIC_OR_DEPARTMENT_OTHER): Payer: Self-pay

## 2023-04-15 MED ORDER — MOUNJARO 5 MG/0.5ML ~~LOC~~ SOAJ
5.0000 mg | SUBCUTANEOUS | 0 refills | Status: DC
  Filled 2023-04-15: qty 2, 28d supply, fill #0

## 2023-04-15 NOTE — Addendum Note (Signed)
Addended by: Malena Peer D on: 04/15/2023 03:34 PM   Modules accepted: Orders

## 2023-04-27 ENCOUNTER — Other Ambulatory Visit (HOSPITAL_COMMUNITY): Payer: Self-pay

## 2023-04-27 MED ORDER — MOUNJARO 5 MG/0.5ML ~~LOC~~ SOAJ
5.0000 mg | SUBCUTANEOUS | 0 refills | Status: DC
  Filled 2023-04-27 – 2023-05-09 (×2): qty 2, 28d supply, fill #0

## 2023-04-27 NOTE — Addendum Note (Signed)
Addended by: Malena Peer D on: 04/27/2023 02:10 PM   Modules accepted: Orders

## 2023-04-29 ENCOUNTER — Other Ambulatory Visit (HOSPITAL_COMMUNITY): Payer: Self-pay

## 2023-05-10 ENCOUNTER — Other Ambulatory Visit (HOSPITAL_COMMUNITY): Payer: Self-pay

## 2023-05-12 ENCOUNTER — Other Ambulatory Visit (HOSPITAL_COMMUNITY): Payer: Self-pay

## 2023-05-31 ENCOUNTER — Other Ambulatory Visit: Payer: Self-pay

## 2023-06-01 ENCOUNTER — Other Ambulatory Visit (HOSPITAL_COMMUNITY): Payer: Self-pay

## 2023-06-01 ENCOUNTER — Encounter: Payer: Self-pay | Admitting: Pharmacist

## 2023-06-01 DIAGNOSIS — E669 Obesity, unspecified: Secondary | ICD-10-CM

## 2023-06-01 MED ORDER — ZEPBOUND 5 MG/0.5ML ~~LOC~~ SOAJ
5.0000 mg | SUBCUTANEOUS | 0 refills | Status: DC
Start: 2023-06-01 — End: 2023-07-08
  Filled 2023-06-01 – 2023-06-07 (×3): qty 2, 28d supply, fill #0

## 2023-06-02 ENCOUNTER — Other Ambulatory Visit (HOSPITAL_COMMUNITY): Payer: Self-pay

## 2023-06-07 ENCOUNTER — Other Ambulatory Visit: Payer: Self-pay

## 2023-06-07 ENCOUNTER — Other Ambulatory Visit (HOSPITAL_COMMUNITY): Payer: Self-pay

## 2023-06-08 ENCOUNTER — Other Ambulatory Visit: Payer: Self-pay

## 2023-06-09 ENCOUNTER — Other Ambulatory Visit: Payer: Self-pay

## 2023-06-09 ENCOUNTER — Other Ambulatory Visit (HOSPITAL_COMMUNITY): Payer: Self-pay

## 2023-07-08 ENCOUNTER — Encounter: Payer: Self-pay | Admitting: Pharmacist

## 2023-07-08 ENCOUNTER — Other Ambulatory Visit: Payer: Self-pay

## 2023-07-08 DIAGNOSIS — Z6830 Body mass index (BMI) 30.0-30.9, adult: Secondary | ICD-10-CM

## 2023-07-08 DIAGNOSIS — E669 Obesity, unspecified: Secondary | ICD-10-CM

## 2023-07-08 MED ORDER — ZEPBOUND 5 MG/0.5ML ~~LOC~~ SOAJ
5.0000 mg | SUBCUTANEOUS | 0 refills | Status: DC
Start: 2023-07-08 — End: 2023-08-16
  Filled 2023-07-08: qty 2, 28d supply, fill #0

## 2023-08-12 ENCOUNTER — Other Ambulatory Visit: Payer: Self-pay | Admitting: Cardiology

## 2023-08-12 DIAGNOSIS — E669 Obesity, unspecified: Secondary | ICD-10-CM

## 2023-08-15 ENCOUNTER — Encounter: Payer: Self-pay | Admitting: Pharmacist

## 2023-08-15 DIAGNOSIS — E669 Obesity, unspecified: Secondary | ICD-10-CM

## 2023-08-16 ENCOUNTER — Other Ambulatory Visit (HOSPITAL_COMMUNITY): Payer: Self-pay

## 2023-08-16 MED ORDER — ZEPBOUND 5 MG/0.5ML ~~LOC~~ SOAJ
5.0000 mg | SUBCUTANEOUS | 5 refills | Status: DC
Start: 2023-08-16 — End: 2023-09-22
  Filled 2023-08-16: qty 2, 28d supply, fill #0

## 2023-08-17 ENCOUNTER — Other Ambulatory Visit: Payer: Self-pay

## 2023-08-24 ENCOUNTER — Other Ambulatory Visit (HOSPITAL_COMMUNITY): Payer: Self-pay

## 2023-09-22 ENCOUNTER — Other Ambulatory Visit: Payer: Self-pay

## 2023-09-22 ENCOUNTER — Encounter: Payer: Self-pay | Admitting: Pharmacist

## 2023-09-22 DIAGNOSIS — Z6830 Body mass index (BMI) 30.0-30.9, adult: Secondary | ICD-10-CM

## 2023-09-22 MED ORDER — ZEPBOUND 5 MG/0.5ML ~~LOC~~ SOAJ
5.0000 mg | SUBCUTANEOUS | 11 refills | Status: DC
Start: 2023-09-22 — End: 2024-10-24
  Filled 2023-09-22: qty 2, 28d supply, fill #0
  Filled 2023-10-28: qty 2, 28d supply, fill #1
  Filled 2023-11-20: qty 2, 28d supply, fill #2
  Filled 2023-12-18: qty 2, 28d supply, fill #3
  Filled 2024-01-15: qty 2, 28d supply, fill #4
  Filled 2024-02-26: qty 2, 28d supply, fill #5
  Filled 2024-04-01 (×2): qty 2, 28d supply, fill #6
  Filled 2024-04-26: qty 2, 28d supply, fill #7
  Filled 2024-06-03 – 2024-06-15 (×2): qty 2, 28d supply, fill #8
  Filled 2024-07-08: qty 2, 28d supply, fill #9
  Filled 2024-08-10: qty 2, 28d supply, fill #10
  Filled 2024-09-03: qty 2, 28d supply, fill #11

## 2023-10-27 DIAGNOSIS — L814 Other melanin hyperpigmentation: Secondary | ICD-10-CM | POA: Diagnosis not present

## 2023-10-27 DIAGNOSIS — D225 Melanocytic nevi of trunk: Secondary | ICD-10-CM | POA: Diagnosis not present

## 2023-10-27 DIAGNOSIS — L650 Telogen effluvium: Secondary | ICD-10-CM | POA: Diagnosis not present

## 2023-10-27 DIAGNOSIS — L821 Other seborrheic keratosis: Secondary | ICD-10-CM | POA: Diagnosis not present

## 2023-10-28 ENCOUNTER — Other Ambulatory Visit (HOSPITAL_COMMUNITY): Payer: Self-pay

## 2023-11-20 ENCOUNTER — Other Ambulatory Visit (HOSPITAL_COMMUNITY): Payer: Self-pay

## 2023-11-29 DIAGNOSIS — Z1231 Encounter for screening mammogram for malignant neoplasm of breast: Secondary | ICD-10-CM | POA: Diagnosis not present

## 2023-12-18 ENCOUNTER — Other Ambulatory Visit (HOSPITAL_COMMUNITY): Payer: Self-pay

## 2023-12-22 ENCOUNTER — Encounter: Payer: Self-pay | Admitting: Pharmacist

## 2023-12-27 ENCOUNTER — Other Ambulatory Visit (HOSPITAL_COMMUNITY): Payer: Self-pay

## 2023-12-27 ENCOUNTER — Telehealth: Payer: Self-pay | Admitting: Pharmacy Technician

## 2023-12-27 NOTE — Telephone Encounter (Signed)
Pharmacy Patient Advocate Encounter   Received notification from Physician's Office that prior authorization for zepbound is required/requested.   Insurance verification completed.   The patient is insured through  [employer]  .   Per test claim: The current 12/27/23 day co-pay is, $24.99 .  No PA needed at this time. This test claim was processed through [facility]- copay amounts may vary at other pharmacies due to pharmacy/plan contracts, or as the patient moves through the different stages of their insurance plan.

## 2024-01-15 ENCOUNTER — Other Ambulatory Visit (HOSPITAL_COMMUNITY): Payer: Self-pay

## 2024-01-18 ENCOUNTER — Telehealth: Payer: Self-pay | Admitting: Pharmacy Technician

## 2024-01-18 ENCOUNTER — Encounter (HOSPITAL_COMMUNITY): Payer: Self-pay

## 2024-01-18 ENCOUNTER — Other Ambulatory Visit (HOSPITAL_COMMUNITY): Payer: Self-pay

## 2024-01-18 NOTE — Telephone Encounter (Signed)
Pharmacy Patient Advocate Encounter   Received notification from Patient Advice Request messages that prior authorization for ZEPBOUND is required/requested.   Insurance verification completed.   The patient is insured through [facility] .   Per test claim: PA required; PA submitted to above mentioned insurance via CoverMyMeds Key/confirmation #/EOC [id number] Status is pending

## 2024-01-19 ENCOUNTER — Telehealth: Payer: Self-pay | Admitting: Pharmacy Technician

## 2024-01-19 NOTE — Telephone Encounter (Signed)
Appeal submitted. See new encounter 01/19/24 for appeal

## 2024-01-19 NOTE — Telephone Encounter (Signed)
Appeal faxed for zepbound 01/19/24 4:23pm

## 2024-01-19 NOTE — Telephone Encounter (Signed)
I just sent the prior authorization request yesterday and never seen any documents requesting more information but now we got this stating it was denied due to not receiving the following: (I can send for appeal once we get the following as well.) full denial is scanned in media. Per note on 12/30/22 prior to starting zepbound "Most recent BMI 30.9." Just need the weight now and the dates the weight was taken.

## 2024-01-20 ENCOUNTER — Other Ambulatory Visit (HOSPITAL_COMMUNITY): Payer: Self-pay

## 2024-01-21 ENCOUNTER — Other Ambulatory Visit: Payer: Self-pay

## 2024-01-21 ENCOUNTER — Other Ambulatory Visit (HOSPITAL_COMMUNITY): Payer: Self-pay

## 2024-01-21 NOTE — Telephone Encounter (Signed)
Pharmacy Patient Advocate Encounter  Received notification from [facility] that Prior Authorization for zepbound has been APPROVED from 01/20/24 to 01/19/25. Spoke to pharmacy to process.Copay is $24.99.

## 2024-02-01 DIAGNOSIS — E7800 Pure hypercholesterolemia, unspecified: Secondary | ICD-10-CM | POA: Diagnosis not present

## 2024-02-01 DIAGNOSIS — D649 Anemia, unspecified: Secondary | ICD-10-CM | POA: Diagnosis not present

## 2024-02-01 DIAGNOSIS — Z0000 Encounter for general adult medical examination without abnormal findings: Secondary | ICD-10-CM | POA: Diagnosis not present

## 2024-02-01 DIAGNOSIS — Z23 Encounter for immunization: Secondary | ICD-10-CM | POA: Diagnosis not present

## 2024-02-21 DIAGNOSIS — R61 Generalized hyperhidrosis: Secondary | ICD-10-CM | POA: Diagnosis not present

## 2024-02-21 DIAGNOSIS — Z1331 Encounter for screening for depression: Secondary | ICD-10-CM | POA: Diagnosis not present

## 2024-02-21 DIAGNOSIS — Z01411 Encounter for gynecological examination (general) (routine) with abnormal findings: Secondary | ICD-10-CM | POA: Diagnosis not present

## 2024-02-21 DIAGNOSIS — Z124 Encounter for screening for malignant neoplasm of cervix: Secondary | ICD-10-CM | POA: Diagnosis not present

## 2024-02-21 DIAGNOSIS — Z01419 Encounter for gynecological examination (general) (routine) without abnormal findings: Secondary | ICD-10-CM | POA: Diagnosis not present

## 2024-02-26 ENCOUNTER — Other Ambulatory Visit (HOSPITAL_COMMUNITY): Payer: Self-pay

## 2024-04-01 ENCOUNTER — Other Ambulatory Visit (HOSPITAL_COMMUNITY): Payer: Self-pay

## 2024-04-26 ENCOUNTER — Other Ambulatory Visit (HOSPITAL_COMMUNITY): Payer: Self-pay

## 2024-06-05 ENCOUNTER — Other Ambulatory Visit (HOSPITAL_COMMUNITY): Payer: Self-pay

## 2024-06-05 ENCOUNTER — Encounter (HOSPITAL_COMMUNITY): Payer: Self-pay

## 2024-06-06 ENCOUNTER — Other Ambulatory Visit: Payer: Self-pay

## 2024-06-06 ENCOUNTER — Other Ambulatory Visit (HOSPITAL_COMMUNITY): Payer: Self-pay

## 2024-06-15 ENCOUNTER — Other Ambulatory Visit: Payer: Self-pay

## 2024-06-15 ENCOUNTER — Other Ambulatory Visit (HOSPITAL_COMMUNITY): Payer: Self-pay

## 2024-07-10 ENCOUNTER — Other Ambulatory Visit (HOSPITAL_COMMUNITY): Payer: Self-pay

## 2024-07-17 DIAGNOSIS — N898 Other specified noninflammatory disorders of vagina: Secondary | ICD-10-CM | POA: Diagnosis not present

## 2024-07-17 DIAGNOSIS — Z79890 Hormone replacement therapy (postmenopausal): Secondary | ICD-10-CM | POA: Diagnosis not present

## 2024-08-10 ENCOUNTER — Other Ambulatory Visit: Payer: Self-pay

## 2024-09-01 ENCOUNTER — Other Ambulatory Visit (HOSPITAL_COMMUNITY): Payer: Self-pay

## 2024-09-01 ENCOUNTER — Other Ambulatory Visit: Payer: Self-pay

## 2024-09-01 MED ORDER — COMBIPATCH 0.05-0.25 MG/DAY TD PTTW
1.0000 | MEDICATED_PATCH | TRANSDERMAL | 0 refills | Status: AC
  Filled 2024-09-01: qty 24, 84d supply, fill #0

## 2024-09-02 ENCOUNTER — Other Ambulatory Visit (HOSPITAL_COMMUNITY): Payer: Self-pay

## 2024-09-03 ENCOUNTER — Other Ambulatory Visit (HOSPITAL_COMMUNITY): Payer: Self-pay

## 2024-09-04 ENCOUNTER — Other Ambulatory Visit: Payer: Self-pay

## 2024-09-05 ENCOUNTER — Encounter: Payer: Self-pay | Admitting: Pharmacist

## 2024-09-05 ENCOUNTER — Other Ambulatory Visit: Payer: Self-pay

## 2024-09-05 ENCOUNTER — Other Ambulatory Visit (HOSPITAL_COMMUNITY): Payer: Self-pay

## 2024-10-18 ENCOUNTER — Other Ambulatory Visit: Payer: Self-pay

## 2024-10-18 ENCOUNTER — Other Ambulatory Visit (HOSPITAL_COMMUNITY): Payer: Self-pay

## 2024-10-20 ENCOUNTER — Other Ambulatory Visit: Payer: Self-pay

## 2024-10-20 ENCOUNTER — Other Ambulatory Visit (HOSPITAL_COMMUNITY): Payer: Self-pay

## 2024-10-20 DIAGNOSIS — R197 Diarrhea, unspecified: Secondary | ICD-10-CM | POA: Diagnosis not present

## 2024-10-24 ENCOUNTER — Other Ambulatory Visit: Payer: Self-pay

## 2024-10-24 ENCOUNTER — Other Ambulatory Visit (HOSPITAL_COMMUNITY): Payer: Self-pay

## 2024-10-24 MED ORDER — ZEPBOUND 5 MG/0.5ML ~~LOC~~ SOAJ
5.0000 mg | SUBCUTANEOUS | 1 refills | Status: DC
  Filled 2024-10-24: qty 2, 28d supply, fill #0
  Filled 2024-11-18: qty 2, 28d supply, fill #1

## 2024-10-25 ENCOUNTER — Encounter: Payer: Self-pay | Admitting: Pharmacist

## 2024-10-25 ENCOUNTER — Other Ambulatory Visit: Payer: Self-pay

## 2024-10-26 ENCOUNTER — Other Ambulatory Visit (HOSPITAL_COMMUNITY): Payer: Self-pay

## 2024-11-18 ENCOUNTER — Other Ambulatory Visit (HOSPITAL_COMMUNITY): Payer: Self-pay

## 2024-11-20 ENCOUNTER — Other Ambulatory Visit: Payer: Self-pay

## 2024-11-20 ENCOUNTER — Other Ambulatory Visit (HOSPITAL_COMMUNITY): Payer: Self-pay

## 2024-11-20 MED ORDER — PROGESTERONE MICRONIZED 100 MG PO CAPS
100.0000 mg | ORAL_CAPSULE | Freq: Every day | ORAL | 0 refills | Status: AC
  Filled 2024-11-20: qty 90, 90d supply, fill #0

## 2024-11-20 MED ORDER — ESTRADIOL 0.05 MG/24HR TD PTTW
1.0000 | MEDICATED_PATCH | TRANSDERMAL | 0 refills | Status: AC
  Filled 2024-11-20: qty 24, 84d supply, fill #0

## 2025-01-11 ENCOUNTER — Other Ambulatory Visit (HOSPITAL_COMMUNITY): Payer: Self-pay

## 2025-01-11 ENCOUNTER — Other Ambulatory Visit: Payer: Self-pay

## 2025-01-11 MED ORDER — ZEPBOUND 5 MG/0.5ML ~~LOC~~ SOAJ
5.0000 mg | SUBCUTANEOUS | 1 refills | Status: AC
  Filled 2025-01-11: qty 2, 28d supply, fill #0
